# Patient Record
Sex: Female | Born: 1950 | Race: Black or African American | Hispanic: No | Marital: Married | Smoking: Never smoker
Health system: Southern US, Community
[De-identification: ages and names within clinical notes are randomized; demographics above are authoritative.]

## PROBLEM LIST (undated history)

## (undated) DIAGNOSIS — T7840XA Allergy, unspecified, initial encounter: Secondary | ICD-10-CM

## (undated) DIAGNOSIS — I10 Essential (primary) hypertension: Secondary | ICD-10-CM

## (undated) HISTORY — PX: ABDOMINAL HYSTERECTOMY: SHX81

## (undated) HISTORY — DX: Essential (primary) hypertension: I10

## (undated) HISTORY — DX: Allergy, unspecified, initial encounter: T78.40XA

---

## 2002-02-13 HISTORY — PX: TONSILLECTOMY: SUR1361

## 2007-02-14 HISTORY — PX: PARTIAL HYSTERECTOMY: SHX80

## 2015-05-26 DIAGNOSIS — Z1329 Encounter for screening for other suspected endocrine disorder: Secondary | ICD-10-CM | POA: Diagnosis not present

## 2015-05-26 DIAGNOSIS — Z7189 Other specified counseling: Secondary | ICD-10-CM | POA: Diagnosis not present

## 2015-05-26 DIAGNOSIS — Z13220 Encounter for screening for lipoid disorders: Secondary | ICD-10-CM | POA: Diagnosis not present

## 2015-05-26 DIAGNOSIS — Z1159 Encounter for screening for other viral diseases: Secondary | ICD-10-CM | POA: Diagnosis not present

## 2015-05-26 DIAGNOSIS — Z131 Encounter for screening for diabetes mellitus: Secondary | ICD-10-CM | POA: Diagnosis not present

## 2015-05-26 DIAGNOSIS — R7303 Prediabetes: Secondary | ICD-10-CM | POA: Diagnosis not present

## 2015-05-26 DIAGNOSIS — I10 Essential (primary) hypertension: Secondary | ICD-10-CM | POA: Diagnosis not present

## 2015-05-26 DIAGNOSIS — Z6831 Body mass index (BMI) 31.0-31.9, adult: Secondary | ICD-10-CM | POA: Diagnosis not present

## 2015-06-23 DIAGNOSIS — D72819 Decreased white blood cell count, unspecified: Secondary | ICD-10-CM | POA: Diagnosis not present

## 2015-06-23 DIAGNOSIS — R7303 Prediabetes: Secondary | ICD-10-CM | POA: Diagnosis not present

## 2015-06-23 DIAGNOSIS — Z1211 Encounter for screening for malignant neoplasm of colon: Secondary | ICD-10-CM | POA: Diagnosis not present

## 2015-06-23 DIAGNOSIS — Z23 Encounter for immunization: Secondary | ICD-10-CM | POA: Diagnosis not present

## 2015-06-23 DIAGNOSIS — Z6832 Body mass index (BMI) 32.0-32.9, adult: Secondary | ICD-10-CM | POA: Diagnosis not present

## 2015-06-23 DIAGNOSIS — Z1239 Encounter for other screening for malignant neoplasm of breast: Secondary | ICD-10-CM | POA: Diagnosis not present

## 2015-06-23 DIAGNOSIS — I10 Essential (primary) hypertension: Secondary | ICD-10-CM | POA: Diagnosis not present

## 2015-06-23 DIAGNOSIS — Z0000 Encounter for general adult medical examination without abnormal findings: Secondary | ICD-10-CM | POA: Diagnosis not present

## 2016-06-12 DIAGNOSIS — R7309 Other abnormal glucose: Secondary | ICD-10-CM | POA: Diagnosis not present

## 2016-06-12 DIAGNOSIS — Z79899 Other long term (current) drug therapy: Secondary | ICD-10-CM | POA: Diagnosis not present

## 2016-06-12 DIAGNOSIS — I10 Essential (primary) hypertension: Secondary | ICD-10-CM | POA: Diagnosis not present

## 2016-06-12 DIAGNOSIS — E559 Vitamin D deficiency, unspecified: Secondary | ICD-10-CM | POA: Diagnosis not present

## 2016-06-12 DIAGNOSIS — Z6831 Body mass index (BMI) 31.0-31.9, adult: Secondary | ICD-10-CM | POA: Diagnosis not present

## 2016-10-20 DIAGNOSIS — E2839 Other primary ovarian failure: Secondary | ICD-10-CM | POA: Diagnosis not present

## 2016-10-20 DIAGNOSIS — R7309 Other abnormal glucose: Secondary | ICD-10-CM | POA: Diagnosis not present

## 2016-10-20 DIAGNOSIS — Z23 Encounter for immunization: Secondary | ICD-10-CM | POA: Diagnosis not present

## 2016-10-20 DIAGNOSIS — I10 Essential (primary) hypertension: Secondary | ICD-10-CM | POA: Diagnosis not present

## 2016-10-20 DIAGNOSIS — Z0000 Encounter for general adult medical examination without abnormal findings: Secondary | ICD-10-CM | POA: Diagnosis not present

## 2016-10-20 DIAGNOSIS — M25511 Pain in right shoulder: Secondary | ICD-10-CM | POA: Diagnosis not present

## 2016-10-26 ENCOUNTER — Other Ambulatory Visit: Payer: Self-pay | Admitting: Internal Medicine

## 2016-10-26 DIAGNOSIS — E2839 Other primary ovarian failure: Secondary | ICD-10-CM

## 2016-10-26 DIAGNOSIS — Z1231 Encounter for screening mammogram for malignant neoplasm of breast: Secondary | ICD-10-CM

## 2017-01-11 DIAGNOSIS — L309 Dermatitis, unspecified: Secondary | ICD-10-CM | POA: Diagnosis not present

## 2017-01-11 DIAGNOSIS — L609 Nail disorder, unspecified: Secondary | ICD-10-CM | POA: Diagnosis not present

## 2017-02-01 DIAGNOSIS — Z1212 Encounter for screening for malignant neoplasm of rectum: Secondary | ICD-10-CM | POA: Diagnosis not present

## 2017-02-01 DIAGNOSIS — Z1211 Encounter for screening for malignant neoplasm of colon: Secondary | ICD-10-CM | POA: Diagnosis not present

## 2017-02-20 DIAGNOSIS — J309 Allergic rhinitis, unspecified: Secondary | ICD-10-CM | POA: Diagnosis not present

## 2017-02-20 DIAGNOSIS — R0689 Other abnormalities of breathing: Secondary | ICD-10-CM | POA: Diagnosis not present

## 2017-02-20 DIAGNOSIS — J209 Acute bronchitis, unspecified: Secondary | ICD-10-CM | POA: Diagnosis not present

## 2017-03-12 DIAGNOSIS — J0120 Acute ethmoidal sinusitis, unspecified: Secondary | ICD-10-CM | POA: Diagnosis not present

## 2017-03-12 DIAGNOSIS — I10 Essential (primary) hypertension: Secondary | ICD-10-CM | POA: Diagnosis not present

## 2017-03-12 DIAGNOSIS — R195 Other fecal abnormalities: Secondary | ICD-10-CM | POA: Diagnosis not present

## 2017-04-20 DIAGNOSIS — E559 Vitamin D deficiency, unspecified: Secondary | ICD-10-CM | POA: Diagnosis not present

## 2017-04-20 DIAGNOSIS — R7309 Other abnormal glucose: Secondary | ICD-10-CM | POA: Diagnosis not present

## 2017-04-20 DIAGNOSIS — I10 Essential (primary) hypertension: Secondary | ICD-10-CM | POA: Diagnosis not present

## 2017-04-20 DIAGNOSIS — Z79899 Other long term (current) drug therapy: Secondary | ICD-10-CM | POA: Diagnosis not present

## 2017-04-20 LAB — LIPID PANEL
Cholesterol: 185 (ref 0–200)
HDL: 56 (ref 35–70)
LDL Cholesterol: 117
LDl/HDL Ratio: 2.1
Triglycerides: 58 (ref 40–160)

## 2017-04-20 LAB — HEPATIC FUNCTION PANEL
ALT: 13 (ref 7–35)
AST: 12 — AB (ref 13–35)
Alkaline Phosphatase: 43 (ref 25–125)

## 2017-04-20 LAB — BASIC METABOLIC PANEL
BUN: 10 (ref 4–21)
Creatinine: 1 (ref 0.5–1.1)
Glucose: 85
Potassium: 4.3 (ref 3.4–5.3)
Sodium: 142 (ref 137–147)

## 2017-04-20 LAB — HEMOGLOBIN A1C: Hemoglobin A1C: 6

## 2017-04-20 LAB — VITAMIN D 25 HYDROXY (VIT D DEFICIENCY, FRACTURES): Vit D, 25-Hydroxy: 130

## 2017-04-26 DIAGNOSIS — D123 Benign neoplasm of transverse colon: Secondary | ICD-10-CM | POA: Diagnosis not present

## 2017-04-26 DIAGNOSIS — R195 Other fecal abnormalities: Secondary | ICD-10-CM | POA: Diagnosis not present

## 2017-04-26 DIAGNOSIS — D122 Benign neoplasm of ascending colon: Secondary | ICD-10-CM | POA: Diagnosis not present

## 2017-04-26 DIAGNOSIS — Z1211 Encounter for screening for malignant neoplasm of colon: Secondary | ICD-10-CM | POA: Diagnosis not present

## 2017-04-26 DIAGNOSIS — K635 Polyp of colon: Secondary | ICD-10-CM | POA: Diagnosis not present

## 2017-06-14 DIAGNOSIS — J309 Allergic rhinitis, unspecified: Secondary | ICD-10-CM | POA: Diagnosis not present

## 2017-06-14 DIAGNOSIS — J320 Chronic maxillary sinusitis: Secondary | ICD-10-CM | POA: Diagnosis not present

## 2017-06-14 DIAGNOSIS — H109 Unspecified conjunctivitis: Secondary | ICD-10-CM | POA: Diagnosis not present

## 2017-12-01 ENCOUNTER — Encounter: Payer: Self-pay | Admitting: Internal Medicine

## 2017-12-01 DIAGNOSIS — J309 Allergic rhinitis, unspecified: Secondary | ICD-10-CM

## 2017-12-05 ENCOUNTER — Ambulatory Visit: Payer: Self-pay | Admitting: Internal Medicine

## 2017-12-05 ENCOUNTER — Ambulatory Visit: Payer: Self-pay

## 2017-12-12 ENCOUNTER — Ambulatory Visit (INDEPENDENT_AMBULATORY_CARE_PROVIDER_SITE_OTHER): Payer: Medicare Other

## 2017-12-12 ENCOUNTER — Encounter: Payer: Self-pay | Admitting: Internal Medicine

## 2017-12-12 ENCOUNTER — Ambulatory Visit (INDEPENDENT_AMBULATORY_CARE_PROVIDER_SITE_OTHER): Payer: Medicare Other | Admitting: Internal Medicine

## 2017-12-12 VITALS — BP 122/72 | HR 60 | Temp 97.9°F | Ht 58.5 in | Wt 163.4 lb

## 2017-12-12 VITALS — BP 122/72 | HR 98 | Temp 97.9°F | Resp 98 | Ht 58.5 in | Wt 163.0 lb

## 2017-12-12 DIAGNOSIS — M67431 Ganglion, right wrist: Secondary | ICD-10-CM | POA: Diagnosis not present

## 2017-12-12 DIAGNOSIS — Z23 Encounter for immunization: Secondary | ICD-10-CM

## 2017-12-12 DIAGNOSIS — R7303 Prediabetes: Secondary | ICD-10-CM

## 2017-12-12 DIAGNOSIS — Z0000 Encounter for general adult medical examination without abnormal findings: Secondary | ICD-10-CM | POA: Diagnosis not present

## 2017-12-12 DIAGNOSIS — E7800 Pure hypercholesterolemia, unspecified: Secondary | ICD-10-CM

## 2017-12-12 DIAGNOSIS — I10 Essential (primary) hypertension: Secondary | ICD-10-CM | POA: Diagnosis not present

## 2017-12-12 DIAGNOSIS — R7309 Other abnormal glucose: Secondary | ICD-10-CM | POA: Diagnosis not present

## 2017-12-12 NOTE — Patient Instructions (Signed)
You may wear a wrist for 3-7 days when you have increased flairs of pain of your R wrist.    Ganglion Cyst A ganglion cyst is a noncancerous, fluid-filled lump that occurs near joints or tendons. The ganglion cyst grows out of a joint or the lining of a tendon. It most often develops in the hand or wrist, but it can also develop in the shoulder, elbow, hip, knee, ankle, or foot. The round or oval ganglion cyst can be the size of a pea or larger than a grape. Increased activity may enlarge the size of the cyst because more fluid starts to build up. What are the causes? It is not known what causes a ganglion cyst to grow. However, it may be related to:  Inflammation or irritation around the joint.  An injury.  Repetitive movements or overuse.  Arthritis.  What increases the risk? Risk factors include:  Being a woman.  Being age 75-50.  What are the signs or symptoms? Symptoms may include:  A lump. This most often appears on the hand or wrist, but it can occur in other areas of the body.  Tingling.  Pain.  Numbness.  Muscle weakness.  Weak grip.  Less movement in a joint.  How is this diagnosed? Ganglion cysts are most often diagnosed based on a physical exam. Your health care provider will feel the lump and may shine a light alongside it. If it is a ganglion cyst, a light often shines through it. Your health care provider may order an X-ray, ultrasound, or MRI to rule out other conditions. How is this treated? Ganglion cysts usually go away on their own without treatment. If pain or other symptoms are involved, treatment may be needed. Treatment is also needed if the ganglion cyst limits your movement or if it gets infected. Treatment may include:  Wearing a brace or splint on your wrist or finger.  Taking anti-inflammatory medicine.  Draining fluid from the lump with a needle (aspiration).  Injecting a steroid into the joint.  Surgery to remove the ganglion  cyst.  Follow these instructions at home:  Do not press on the ganglion cyst, poke it with a needle, or hit it.  Take medicines only as directed by your health care provider.  Wear your brace or splint as directed by your health care provider.  Watch your ganglion cyst for any changes.  Keep all follow-up visits as directed by your health care provider. This is important. Contact a health care provider if:  Your ganglion cyst becomes larger or more painful.  You have increased redness, red streaks, or swelling.  You have pus coming from the lump.  You have weakness or numbness in the affected area.  You have a fever or chills. This information is not intended to replace advice given to you by your health care provider. Make sure you discuss any questions you have with your health care provider. Document Released: 01/28/2000 Document Revised: 07/08/2015 Document Reviewed: 07/15/2013 Elsevier Interactive Patient Education  2018 [employer].

## 2017-12-12 NOTE — Patient Instructions (Signed)
Alyssa Lee , Thank you for taking time to come for your Medicare Wellness Visit. I appreciate your ongoing commitment to your health goals. Please review the following plan we discussed and let me know if I can assist you in the future.   Screening recommendations/referrals: Colonoscopy: fobt due 06/2018 Mammogram: due Bone Density: due Recommended yearly ophthalmology/optometry visit for glaucoma screening and checkup Recommended yearly dental visit for hygiene and checkup  Vaccinations: Influenza vaccine: decline Pneumococcal vaccine: none Tdap vaccine: 06/24/2017 Shingles vaccine: decline    Advanced directives: Advance directive discussed with you today. I have provided a copy for you to complete at home and have notarized. Once this is complete please bring a copy in to our office so we can scan it into your chart.   Conditions/risks identified: Obesity: patient wants to start exercising regularly. Tends to 43 month old at this time.  Requires a lot of energy.  Next appointment: 03/14/2018 at 11:15a   Preventive Care 65 Years and Older, Female Preventive care refers to lifestyle choices and visits with your health care provider that can promote health and wellness. What does preventive care include?  A yearly physical exam. This is also called an annual well check.  Dental exams once or twice a year.  Routine eye exams. Ask your health care provider how often you should have your eyes checked.  Personal lifestyle choices, including:  Daily care of your teeth and gums.  Regular physical activity.  Eating a healthy diet.  Avoiding tobacco and drug use.  Limiting alcohol use.  Practicing safe sex.  Taking low-dose aspirin every day.  Taking vitamin and mineral supplements as recommended by your health care provider. What happens during an annual well check? The services and screenings done by your health care provider during your annual well check will depend on  your age, overall health, lifestyle risk factors, and family history of disease. Counseling  Your health care provider may ask you questions about your:  Alcohol use.  Tobacco use.  Drug use.  Emotional well-being.  Home and relationship well-being.  Sexual activity.  Eating habits.  History of falls.  Memory and ability to understand (cognition).  Work and work Statistician.  Reproductive health. Screening  You may have the following tests or measurements:  Height, weight, and BMI.  Blood pressure.  Lipid and cholesterol levels. These may be checked every 5 years, or more frequently if you are over 3 years old.  Skin check.  Lung cancer screening. You may have this screening every year starting at age 67 if you have a 30-pack-year history of smoking and currently smoke or have quit within the past 15 years.  Fecal occult blood test (FOBT) of the stool. You may have this test every year starting at age 74.  Flexible sigmoidoscopy or colonoscopy. You may have a sigmoidoscopy every 5 years or a colonoscopy every 10 years starting at age 86.  Hepatitis C blood test.  Hepatitis B blood test.  Sexually transmitted disease (STD) testing.  Diabetes screening. This is done by checking your blood sugar (glucose) after you have not eaten for a while (fasting). You may have this done every 1-3 years.  Bone density scan. This is done to screen for osteoporosis. You may have this done starting at age 104.  Mammogram. This may be done every 1-2 years. Talk to your health care provider about how often you should have regular mammograms. Talk with your health care provider about your test results, treatment  options, and if necessary, the need for more tests. Vaccines  Your health care provider may recommend certain vaccines, such as:  Influenza vaccine. This is recommended every year.  Tetanus, diphtheria, and acellular pertussis (Tdap, Td) vaccine. You may need a Td booster  every 10 years.  Zoster vaccine. You may need this after age 90.  Pneumococcal 13-valent conjugate (PCV13) vaccine. One dose is recommended after age 12.  Pneumococcal polysaccharide (PPSV23) vaccine. One dose is recommended after age 50. Talk to your health care provider about which screenings and vaccines you need and how often you need them. This information is not intended to replace advice given to you by your health care provider. Make sure you discuss any questions you have with your health care provider. Document Released: 02/26/2015 Document Revised: 10/20/2015 Document Reviewed: 12/01/2014 Elsevier Interactive Patient Education  2017 [place] Prevention in the Home Falls can cause injuries. They can happen to people of all ages. There are many things you can do to make your home safe and to help prevent falls. What can I do on the outside of my home?  Regularly fix the edges of walkways and driveways and fix any cracks.  Remove anything that might make you trip as you walk through a door, such as a raised step or threshold.  Trim any bushes or trees on the path to your home.  Use bright outdoor lighting.  Clear any walking paths of anything that might make someone trip, such as rocks or tools.  Regularly check to see if handrails are loose or broken. Make sure that both sides of any steps have handrails.  Any raised decks and porches should have guardrails on the edges.  Have any leaves, snow, or ice cleared regularly.  Use sand or salt on walking paths during winter.  Clean up any spills in your garage right away. This includes oil or grease spills. What can I do in the bathroom?  Use night lights.  Install grab bars by the toilet and in the tub and shower. Do not use towel bars as grab bars.  Use non-skid mats or decals in the tub or shower.  If you need to sit down in the shower, use a plastic, non-slip stool.  Keep the floor dry. Clean up any  water that spills on the floor as soon as it happens.  Remove soap buildup in the tub or shower regularly.  Attach bath mats securely with double-sided non-slip rug tape.  Do not have throw rugs and other things on the floor that can make you trip. What can I do in the bedroom?  Use night lights.  Make sure that you have a light by your bed that is easy to reach.  Do not use any sheets or blankets that are too big for your bed. They should not hang down onto the floor.  Have a firm chair that has side arms. You can use this for support while you get dressed.  Do not have throw rugs and other things on the floor that can make you trip. What can I do in the kitchen?  Clean up any spills right away.  Avoid walking on wet floors.  Keep items that you use a lot in easy-to-reach places.  If you need to reach something above you, use a strong step stool that has a grab bar.  Keep electrical cords out of the way.  Do not use floor polish or wax that makes floors slippery.  If you must use wax, use non-skid floor wax.  Do not have throw rugs and other things on the floor that can make you trip. What can I do with my stairs?  Do not leave any items on the stairs.  Make sure that there are handrails on both sides of the stairs and use them. Fix handrails that are broken or loose. Make sure that handrails are as long as the stairways.  Check any carpeting to make sure that it is firmly attached to the stairs. Fix any carpet that is loose or worn.  Avoid having throw rugs at the top or bottom of the stairs. If you do have throw rugs, attach them to the floor with carpet tape.  Make sure that you have a light switch at the top of the stairs and the bottom of the stairs. If you do not have them, ask someone to add them for you. What else can I do to help prevent falls?  Wear shoes that:  Do not have high heels.  Have rubber bottoms.  Are comfortable and fit you well.  Are closed  at the toe. Do not wear sandals.  If you use a stepladder:  Make sure that it is fully opened. Do not climb a closed stepladder.  Make sure that both sides of the stepladder are locked into place.  Ask someone to hold it for you, if possible.  Clearly mark and make sure that you can see:  Any grab bars or handrails.  First and last steps.  Where the edge of each step is.  Use tools that help you move around (mobility aids) if they are needed. These include:  Canes.  Walkers.  Scooters.  Crutches.  Turn on the lights when you go into a dark area. Replace any light bulbs as soon as they burn out.  Set up your furniture so you have a clear path. Avoid moving your furniture around.  If any of your floors are uneven, fix them.  If there are any pets around you, be aware of where they are.  Review your medicines with your doctor. Some medicines can make you feel dizzy. This can increase your chance of falling. Ask your doctor what other things that you can do to help prevent falls. This information is not intended to replace advice given to you by your health care provider. Make sure you discuss any questions you have with your health care provider. Document Released: 11/26/2008 Document Revised: 07/08/2015 Document Reviewed: 03/06/2014 Elsevier Interactive Patient Education  2017 [employer].

## 2017-12-12 NOTE — Progress Notes (Addendum)
Subjective:     Patient ID: Alyssa Lee , female    DOB: September 10, 1950 , 67 y.o.   MRN: [id number]     HPI 1-Here for HTN FU, average is usually 120/ 70-80's. Has not been walking like she should since she is raising her 36 month old infant grandson. 2- R medial wrist lump x 2 months, is a little tender and admits of using her R hand more with cleaning, lifting her 30 lb infant, and walking her dog. She denies an acute injury.    Past Medical History:  Diagnosis Date  . Hypertension    Prediabetes  Family History  Problem Relation Age of Onset  . Hyperthyroidism Mother   . Hypertension Mother   . Diabetes Mother   . Hypertension Father   . COPD Father   . Kidney failure Father      Current Outpatient Medications:  .  amLODipine (NORVASC) 10 MG tablet, Take 10 mg by mouth daily., Disp: , Rfl:  .  Cholecalciferol (VITAMIN D3) 5000 units CAPS, Take by mouth., Disp: , Rfl:  .  lisinopril-hydrochlorothiazide (PRINZIDE,ZESTORETIC) 20-25 MG tablet, Take 1 tablet by mouth daily., Disp: , Rfl:    No Known Allergies   Review of Systems  Constitutional: Positive for diaphoresis.       Gets occasional sweating at night and taking her feet out of the covers helps her cool down  HENT: Negative for tinnitus.   Respiratory: Negative for chest tightness and shortness of breath.   Cardiovascular: Negative for chest pain, palpitations and leg swelling.  Gastrointestinal: Positive for nausea. Negative for abdominal pain, blood in stool and vomiting.       Gets L lower chest pain associated with food or if she ateas late. Eating helps.  Gets nausea sometimes in the am.   Musculoskeletal:       Has R medial wrist lump x 2 months and uses R wrist  Neurological: Positive for dizziness. Negative for facial asymmetry, speech difficulty, weakness and headaches.       Has had slight off balance issues associated with allergies.      Today's Vitals   12/12/17 1220  BP: 122/72  Pulse: 60   Temp: 97.9 F (36.6 C)  TempSrc: Oral  SpO2: 98%  Weight: 163 lb 6.4 oz (74.1 kg)  Height: 4' 10.5" (1.486 m)   Body mass index is 33.57 kg/m.   Objective:  Physical Exam   Constitutional: She is oriented to person, place, and time. She appears well-developed and well-nourished. No distress.  HENT:  Head: Normocephalic and atraumatic.  Right Ear: External ear normal.  Left Ear: External ear normal.  Nose: Nose normal.  Eyes: Conjunctivae are normal. Right eye exhibits no discharge. Left eye exhibits no discharge. No scleral icterus.  Neck: Neck supple. No thyromegaly present.  No carotid bruits bilaterally  Cardiovascular: Normal rate and regular rhythm.  No murmur heard. Pulmonary/Chest: Effort normal and breath sounds normal. No respiratory distress.  Musculoskeletal: Normal range of motion. She exhibits no edema.  R WRIST/HAND- has 1.5 x 1.5 cm rubbery mobile mass on distal radius which is a little tender. ROM of wrist is normal. No redness or swelling noted.  Lymphadenopathy:    She has no cervical adenopathy.  Neurological: She is alert and oriented to person, place, and time.  Skin: Skin is warm and dry. Capillary refill takes less than 2 seconds. No rash noted. She is not diaphoretic.  Psychiatric: She has a normal mood and  affect. Her behavior is normal. Judgment and thought content normal.  Nursing note reviewed.     Assessment And Plan:  1. Need for influenza vaccination- she was given  - Flu vaccine HIGH DOSE PF (Fluzone High dose)  2. Essential hypertension- controlled. I ordered the following:  - CMP14 + Anion Gap - CBC no Diff Medicare wellness visit was done by LPN today 3. Abnormal glucose- has been prediabetic in the past. I ordered the following - Hemoglobin A1c  4. Elevated LDL cholesterol level- chronic. The following was ordered  - Lipid Profile - TSH - T4, Free - T3, free  5. Ganglion cyst of wrist, right- acute. Advised to wear a wrist  splint. immobilizer for 3-7 days when she has increased pain. We can refer to ortho, if it continues bothering her.    May continue current medication, advised to take walks at least 30 minutes twice a week. She will try this.   Wilton Thrall RODRIGUEZ-SOUTHWORTH, PA-C

## 2017-12-12 NOTE — Progress Notes (Signed)
Subjective:   Alyssa Lee is a 67 y.o. female who presents for Medicare Annual (Subsequent) preventive examination.  Review of Systems:  n/a Cardiac Risk Factors include: advanced age (>75men, >92 women)     Objective:     Vitals: BP 122/72 (BP Location: Left Arm)   Pulse 98   Temp 97.9 F (36.6 C)   Resp (!) 98   Ht 4' 10.5" (1.486 m)   Wt 163 lb (73.9 kg)   BMI 33.49 kg/m   Body mass index is 33.49 kg/m.  Advanced Directives 12/12/2017  Does Patient Have a Medical Advance Directive? No  Would patient like information on creating a medical advance directive? Yes (MAU/Ambulatory/Procedural Areas - Information given)    Tobacco Social History   Tobacco Use  Smoking Status Never Smoker  Smokeless Tobacco Never Used     Counseling given: Not Answered   Clinical Intake:  Pre-visit preparation completed: Yes  Pain : No/denies pain Pain Score: 0-No pain     Nutritional Status: BMI > 30  Obese Nutritional Risks: None Diabetes: No  How often do you need to have someone help you when you read instructions, pamphlets, or other written materials from your doctor or pharmacy?: 1 - Never What is the last grade level you completed in school?: associate degree  Interpreter Needed?: No  Information entered by :: NAllen LPN  Past Medical History:  Diagnosis Date  . Hypertension    Past Surgical History:  Procedure Laterality Date  . CESAREAN SECTION    . PARTIAL HYSTERECTOMY  2009  . TONSILLECTOMY  2004   Family History  Problem Relation Age of Onset  . Hyperthyroidism Mother   . Hypertension Mother   . Diabetes Mother   . Hypertension Father   . COPD Father   . Kidney failure Father    Social History   Socioeconomic History  . Marital status: Married    Spouse name: Not on file  . Number of children: Not on file  . Years of education: Not on file  . Highest education level: Not on file  Occupational History  . Occupation: retired  Photographer  . Financial resource strain: Not hard at all  . Food insecurity:    Worry: Never true    Inability: Never true  . Transportation needs:    Medical: No    Non-medical: No  Tobacco Use  . Smoking status: Never Smoker  . Smokeless tobacco: Never Used  Substance and Sexual Activity  . Alcohol use: Never    Frequency: Never  . Drug use: Never  . Sexual activity: Not Currently  Lifestyle  . Physical activity:    Days per week: 0 days    Minutes per session: 0 min  . Stress: Not at all  Relationships  . Social connections:    Talks on phone: Not on file    Gets together: Not on file    Attends religious service: Not on file    Active member of club or organization: Not on file    Attends meetings of clubs or organizations: Not on file    Relationship status: Not on file  Other Topics Concern  . Not on file  Social History Narrative  . Not on file    Outpatient Encounter Medications as of 12/12/2017  Medication Sig  . amLODipine (NORVASC) 10 MG tablet Take 10 mg by mouth daily.  . Cholecalciferol (VITAMIN D3) 5000 units CAPS Take by mouth.  . fluticasone (FLONASE)  50 MCG/ACT nasal spray   . lisinopril-hydrochlorothiazide (PRINZIDE,ZESTORETIC) 20-25 MG tablet Take 1 tablet by mouth daily.   No facility-administered encounter medications on file as of 12/12/2017.     Activities of Daily Living In your present state of health, do you have any difficulty performing the following activities: 12/12/2017  Hearing? N  Vision? Y  Comment uses over the counter readers  Difficulty concentrating or making decisions? N  Walking or climbing stairs? N  Dressing or bathing? N  Doing errands, shopping? N  Preparing Food and eating ? N  Using the Toilet? N  In the past six months, have you accidently leaked urine? N  Do you have problems with loss of bowel control? N  Managing your Medications? N  Managing your Finances? N  Housekeeping or managing your Housekeeping? N  Some  recent data might be hidden    Patient Care Team: Glendale Chard, MD as PCP - General (Internal Medicine)    Assessment:   This is a routine wellness examination for Alyssa Lee.  Exercise Activities and Dietary recommendations Current Exercise Habits: The patient does not participate in regular exercise at present, Exercise limited by: None identified  Goals    . Exercise 150 min/wk Moderate Activity (pt-stated)     Would like to exercise regularly       Fall Risk Fall Risk  12/12/2017 12/12/2017  Falls in the past year? No No  Risk for fall due to : Medication side effect -   Is the patient's home free of loose throw rugs in walkways, pet beds, electrical cords, etc?   yes      Grab bars in the bathroom? no      Handrails on the stairs?   yes      Adequate lighting?   yes  Timed Get Up and Go performed: n/a  Depression Screen PHQ 2/9 Scores 12/12/2017 12/12/2017  PHQ - 2 Score 0 0  PHQ- 9 Score 3 -     Cognitive Function     6CIT Screen 12/12/2017  What Year? 0 points  What month? 0 points  What time? 0 points  Months in reverse 0 points  Repeat phrase 0 points    Immunization History  Administered Date(s) Administered  . Influenza, High Dose Seasonal PF 12/12/2017    Qualifies for Shingles Vaccine?yes  Screening Tests Health Maintenance  Topic Date Due  . Hepatitis C Screening  June 03, 1950  . TETANUS/TDAP  04/01/1969  . MAMMOGRAM  04/01/2000  . COLONOSCOPY  04/01/2000  . DEXA SCAN  04/02/2015  . PNA vac Low Risk Adult (1 of 2 - PCV13) 04/02/2015  . INFLUENZA VACCINE  09/13/2017    Cancer Screenings: Lung: Low Dose CT Chest recommended if Age 67-80 years, 30 pack-year currently smoking OR have quit w/in 15years. Patient does not qualify. Breast:  Up to date on Mammogram? no   Up to date of Bone Density/Dexa? No Colorectal: fobt due 06/2018  Additional Screenings: : Hepatitis C Screening: due     Plan:    Patient would like to start exercising  regularly   I have personally reviewed and noted the following in the patient's chart:   . Medical and social history . Use of alcohol, tobacco or illicit drugs  . Current medications and supplements . Functional ability and status . Nutritional status . Physical activity . Advanced directives . List of other physicians . Hospitalizations, surgeries, and ER visits in previous 12 months . Vitals . Screenings to include  cognitive, depression, and falls . Referrals and appointments  In addition, I have reviewed and discussed with patient certain preventive protocols, quality metrics, and best practice recommendations. A written personalized care plan for preventive services as well as general preventive health recommendations were provided to patient.     Kellie Simmering, LPN  14/48/1856

## 2017-12-13 LAB — CMP14 + ANION GAP
ALT: 14 IU/L (ref 0–32)
AST: 16 IU/L (ref 0–40)
Albumin/Globulin Ratio: 1.5 (ref 1.2–2.2)
Albumin: 4.5 g/dL (ref 3.6–4.8)
Alkaline Phosphatase: 49 IU/L (ref 39–117)
Anion Gap: 14 mmol/L (ref 10.0–18.0)
BUN/Creatinine Ratio: 18 (ref 12–28)
BUN: 17 mg/dL (ref 8–27)
Bilirubin Total: 0.3 mg/dL (ref 0.0–1.2)
CO2: 27 mmol/L (ref 20–29)
Calcium: 10 mg/dL (ref 8.7–10.3)
Chloride: 98 mmol/L (ref 96–106)
Creatinine, Ser: 0.94 mg/dL (ref 0.57–1.00)
GFR calc Af Amer: 73 mL/min/{1.73_m2} (ref >59)
GFR calc non Af Amer: 63 mL/min/{1.73_m2} (ref >59)
Globulin, Total: 3.1 g/dL (ref 1.5–4.5)
Glucose: 93 mg/dL (ref 65–99)
Potassium: 4.3 mmol/L (ref 3.5–5.2)
Sodium: 139 mmol/L (ref 134–144)
Total Protein: 7.6 g/dL (ref 6.0–8.5)

## 2017-12-13 LAB — TSH: TSH: 1.29 u[IU]/mL (ref 0.450–4.500)

## 2017-12-13 LAB — CBC
Hematocrit: 40 % (ref 34.0–46.6)
Hemoglobin: 13.3 g/dL (ref 11.1–15.9)
MCH: 28.2 pg (ref 26.6–33.0)
MCHC: 33.3 g/dL (ref 31.5–35.7)
MCV: 85 fL (ref 79–97)
Platelets: 302 10*3/uL (ref 150–450)
RBC: 4.71 x10E6/uL (ref 3.77–5.28)
RDW: 13.1 % (ref 12.3–15.4)
WBC: 4 10*3/uL (ref 3.4–10.8)

## 2017-12-13 LAB — LIPID PANEL
Chol/HDL Ratio: 3.6 ratio (ref 0.0–4.4)
Cholesterol, Total: 210 mg/dL — ABNORMAL HIGH (ref 100–199)
HDL: 58 mg/dL (ref >39)
LDL Calculated: 128 mg/dL — ABNORMAL HIGH (ref 0–99)
Triglycerides: 119 mg/dL (ref 0–149)
VLDL Cholesterol Cal: 24 mg/dL (ref 5–40)

## 2017-12-13 LAB — HEMOGLOBIN A1C
Est. average glucose Bld gHb Est-mCnc: 128 mg/dL
Hgb A1c MFr Bld: 6.1 % — ABNORMAL HIGH (ref 4.8–5.6)

## 2017-12-13 LAB — T4, FREE: Free T4: 1.3 ng/dL (ref 0.82–1.77)

## 2017-12-13 LAB — T3, FREE: T3, Free: 2.9 pg/mL (ref 2.0–4.4)

## 2018-02-18 ENCOUNTER — Other Ambulatory Visit: Payer: Self-pay | Admitting: Internal Medicine

## 2018-03-14 ENCOUNTER — Ambulatory Visit (INDEPENDENT_AMBULATORY_CARE_PROVIDER_SITE_OTHER): Payer: Medicare Other | Admitting: Internal Medicine

## 2018-03-14 ENCOUNTER — Encounter: Payer: Self-pay | Admitting: Internal Medicine

## 2018-03-14 ENCOUNTER — Other Ambulatory Visit: Payer: Self-pay

## 2018-03-14 VITALS — BP 130/72 | HR 60 | Temp 98.7°F | Ht <= 58 in | Wt 159.6 lb

## 2018-03-14 DIAGNOSIS — I10 Essential (primary) hypertension: Secondary | ICD-10-CM | POA: Diagnosis not present

## 2018-03-14 DIAGNOSIS — M779 Enthesopathy, unspecified: Secondary | ICD-10-CM | POA: Diagnosis not present

## 2018-03-14 DIAGNOSIS — E7800 Pure hypercholesterolemia, unspecified: Secondary | ICD-10-CM | POA: Diagnosis not present

## 2018-03-14 DIAGNOSIS — M778 Other enthesopathies, not elsewhere classified: Secondary | ICD-10-CM

## 2018-03-14 DIAGNOSIS — M6740 Ganglion, unspecified site: Secondary | ICD-10-CM | POA: Insufficient documentation

## 2018-03-14 DIAGNOSIS — R7303 Prediabetes: Secondary | ICD-10-CM | POA: Diagnosis not present

## 2018-03-14 MED ORDER — THUMB SPLINT/RIGHT SMALL MISC: 1.0000 [IU] | Freq: Every day | 0 refills | Status: AC

## 2018-03-14 NOTE — Patient Instructions (Signed)
Take your blood pressure  Medication in the morning including the water pill Take the Vit D with a meal that has some fat.

## 2018-03-14 NOTE — Progress Notes (Signed)
Subjective:     Patient ID: Alyssa Lee , female    DOB: May 13, 1950 , 68 y.o.   MRN: [id number]   Chief Complaint  Patient presents with  . Hypertension  . Wrist Pain    HPI  1- Pt is here for HTN FU.      BP's at home normally runs around 117/70-75.     She continues staying busy taking care of her 74 months old great             grandson.  2- Her R wrist continues to bother her. Has been wearing a soft hand and wrist brace, but because she uses her hands a lot lifting her GGS, she continues having pain. The ganglion cyst area is still present.  Past Medical History:  Diagnosis Date  . Hypertension      Family History  Problem Relation Age of Onset  . Hyperthyroidism Mother   . Hypertension Mother   . Diabetes Mother   . Hypertension Father   . COPD Father   . Kidney failure Father      Current Outpatient Medications:  .  amLODipine (NORVASC) 10 MG tablet, TAKE 1 TABLET DAILY, Disp: 90 tablet, Rfl: 4 .  Cholecalciferol (VITAMIN D3) 5000 units CAPS, Take by mouth., Disp: , Rfl:  .  fluticasone (FLONASE) 50 MCG/ACT nasal spray, , Disp: , Rfl:  .  lisinopril-hydrochlorothiazide (PRINZIDE,ZESTORETIC) 20-25 MG tablet, TAKE 1 TABLET DAILY, Disp: 90 tablet, Rfl: 4   No Known Allergies   Review of Systems  Constitutional: Negative for appetite change, diaphoresis and unexpected weight change.       Has been eating less meat and more vegetables and smaller portions.   HENT: Negative for congestion, postnasal drip and rhinorrhea.        Using Flonase most of the time has helped with post nasal drainage she used to have.  Denies epistaxis.  Eyes: Negative for visual disturbance.  Respiratory: Negative for shortness of breath.   Cardiovascular: Negative for chest pain, palpitations and leg swelling.  Gastrointestinal: Negative for constipation, diarrhea, nausea and vomiting.  Endocrine: Negative for polydipsia and polyphagia.  Genitourinary: Negative for difficulty  urinating, dysuria and frequency.       Nocturia from taking the diuretic at night time.   Musculoskeletal: Positive for arthralgias.       R wrist pain, see HPI  Skin: Negative for rash.  Neurological: Negative for dizziness, speech difficulty, weakness, light-headedness and headaches.     Today's Vitals   03/14/18 1104  BP: 130/72  Pulse: 60  Temp: 98.7 F (37.1 C)  TempSrc: Oral  SpO2: 98%  Weight: 159 lb 9.6 oz (72.4 kg)  Height: 4\' 10"  (1.473 m)   Body mass index is 33.36 kg/m.   Objective:  Physical Exam  Has lost 4 lb since October Constitutional: She is oriented to person, place, and time. She appears well-developed and well-nourished. No distress.  HENT:  Head: Normocephalic and atraumatic.  Right Ear: External ear normal.  Left Ear: External ear normal.  Nose: Nose normal.  Eyes: Conjunctivae are normal. Right eye exhibits no discharge. Left eye exhibits no discharge. No scleral icterus.  Neck: Neck supple. No thyromegaly present.  No carotid bruits bilaterally  Cardiovascular: Normal rate and regular rhythm.  No murmur heard. Pulmonary/Chest: Effort normal and breath sounds normal. No respiratory distress.  Musculoskeletal: Normal range of motion. She exhibits no edema. Has small ganglion cyst on radial aspect of R wrist and thumb tendon  is also tender with palpation and movement of her R thumb  Lymphadenopathy:    She has no cervical adenopathy.  Neurological: She is alert and oriented to person, place, and time.  Skin: Skin is warm and dry. Capillary refill takes less than 2 seconds. No rash noted. She is not diaphoretic.  Psychiatric: She has a normal mood and affect. Her behavior is normal. Judgment and thought content normal.  Nursing note reviewed.     Assessment And Plan:    1. Ganglion cyst- R wrist, unchnaged - Ambulatory referral to Orthopedic Surgery  2. Thumb tendonitis- R thumb, acute.      She was give Rx for thumb spica splint to wear qd  until she sees ortho.  - Ambulatory referral to Orthopedic Surgery  3. Essential hypertension- stable. May continue same medications. As she is loosing wt was told to watch out for orthostatic hypotension.  - CMP14 + Anion Gap  4. Elevated LDL cholesterol level- unknown status - Lipid Profile - CBC no Diff  5. Prediabetes- unknown status. - Hemoglobin A1c  Fu in 3 months for HTN  FU.   Devan Danzer RODRIGUEZ-SOUTHWORTH, PA-C

## 2018-03-15 ENCOUNTER — Ambulatory Visit (INDEPENDENT_AMBULATORY_CARE_PROVIDER_SITE_OTHER): Payer: Medicare Other | Admitting: Orthopaedic Surgery

## 2018-03-15 ENCOUNTER — Encounter (INDEPENDENT_AMBULATORY_CARE_PROVIDER_SITE_OTHER): Payer: Self-pay | Admitting: Orthopaedic Surgery

## 2018-03-15 ENCOUNTER — Ambulatory Visit (INDEPENDENT_AMBULATORY_CARE_PROVIDER_SITE_OTHER): Payer: Medicare Other

## 2018-03-15 VITALS — Ht <= 58 in | Wt 159.0 lb

## 2018-03-15 DIAGNOSIS — M654 Radial styloid tenosynovitis [de Quervain]: Secondary | ICD-10-CM

## 2018-03-15 LAB — CBC
Hematocrit: 38.5 % (ref 34.0–46.6)
Hemoglobin: 12.8 g/dL (ref 11.1–15.9)
MCH: 28.8 pg (ref 26.6–33.0)
MCHC: 33.2 g/dL (ref 31.5–35.7)
MCV: 87 fL (ref 79–97)
Platelets: 293 10*3/uL (ref 150–450)
RBC: 4.45 x10E6/uL (ref 3.77–5.28)
RDW: 13.6 % (ref 11.7–15.4)
WBC: 3.7 10*3/uL (ref 3.4–10.8)

## 2018-03-15 LAB — LIPID PANEL
Chol/HDL Ratio: 3.2 ratio (ref 0.0–4.4)
Cholesterol, Total: 203 mg/dL — ABNORMAL HIGH (ref 100–199)
HDL: 63 mg/dL (ref >39)
LDL Calculated: 125 mg/dL — ABNORMAL HIGH (ref 0–99)
Triglycerides: 76 mg/dL (ref 0–149)
VLDL Cholesterol Cal: 15 mg/dL (ref 5–40)

## 2018-03-15 LAB — CMP14 + ANION GAP
ALT: 12 IU/L (ref 0–32)
AST: 12 IU/L (ref 0–40)
Albumin/Globulin Ratio: 1.3 (ref 1.2–2.2)
Albumin: 4.4 g/dL (ref 3.8–4.8)
Alkaline Phosphatase: 49 IU/L (ref 39–117)
Anion Gap: 17 mmol/L (ref 10.0–18.0)
BUN/Creatinine Ratio: 15 (ref 12–28)
BUN: 17 mg/dL (ref 8–27)
Bilirubin Total: 0.4 mg/dL (ref 0.0–1.2)
CO2: 24 mmol/L (ref 20–29)
Calcium: 10.6 mg/dL — ABNORMAL HIGH (ref 8.7–10.3)
Chloride: 101 mmol/L (ref 96–106)
Creatinine, Ser: 1.15 mg/dL — ABNORMAL HIGH (ref 0.57–1.00)
GFR calc Af Amer: 57 mL/min/{1.73_m2} — ABNORMAL LOW (ref >59)
GFR calc non Af Amer: 49 mL/min/{1.73_m2} — ABNORMAL LOW (ref >59)
Globulin, Total: 3.4 g/dL (ref 1.5–4.5)
Glucose: 75 mg/dL (ref 65–99)
Potassium: 4.4 mmol/L (ref 3.5–5.2)
Sodium: 142 mmol/L (ref 134–144)
Total Protein: 7.8 g/dL (ref 6.0–8.5)

## 2018-03-15 LAB — HEMOGLOBIN A1C
Est. average glucose Bld gHb Est-mCnc: 128 mg/dL
Hgb A1c MFr Bld: 6.1 % — ABNORMAL HIGH (ref 4.8–5.6)

## 2018-03-15 MED ORDER — BUPIVACAINE HCL 0.5 % IJ SOLN
0.3300 mL | INTRAMUSCULAR | Status: AC | PRN
  Administered 2018-03-15: .33 mL

## 2018-03-15 MED ORDER — METHYLPREDNISOLONE ACETATE 40 MG/ML IJ SUSP
13.3300 mg | INTRAMUSCULAR | Status: AC | PRN
  Administered 2018-03-15: 13.33 mg

## 2018-03-15 MED ORDER — LIDOCAINE HCL 1 % IJ SOLN
0.3000 mL | INTRAMUSCULAR | Status: AC | PRN
  Administered 2018-03-15: .3 mL

## 2018-03-15 NOTE — Progress Notes (Signed)
Office Visit Note   Patient: Alyssa Lee           Date of Birth: [birth year]           MRN: [id number] Visit Date: 03/15/2018              Requested by: Shelby Mattocks, PA-C [address] [place], [place] [address] PCP: Glendale Chard, MD   Assessment & Plan: Visit Diagnoses:  1. De Quervain's tenosynovitis, right     Plan: Impression is right de Quervain's Tenosynovitis with cystic formation.  We will inject this with cortisone today.  Home also provide the patient with a removable thumb spica splint.  She will follow-up with [place] as needed.  Follow-Up Instructions: Return if symptoms worsen or fail to improve.   Orders:  Orders Placed This Encounter  Procedures  . XR Wrist 2 Views Right   No orders of the defined types were placed in this encounter.     Procedures: Hand/UE Inj: R extensor compartment 1 for de Quervain's tenosynovitis on 03/15/2018 11:41 AM Indications: pain Details: 25 G needle Medications: 0.3 mL lidocaine 1 %; 0.33 mL bupivacaine 0.5 %; 13.33 mg methylPREDNISolone acetate 40 MG/ML Outcome: tolerated well, no immediate complications Patient was prepped and draped in the usual sterile fashion.       Clinical Data: No additional findings.   Subjective: Chief Complaint  Patient presents with  . Right Wrist - Pain    HPI patient is a pleasant 68 year old female who presents our clinic today with right wrist pain.  This is been ongoing for the past month.  No known injury or change in activity.  The pain she has is to the first dorsal compartment.  Pain appears to be worse when using her thumb and occasional wrist movements.  She also notes that she is taking care of a 61-month-old baby boy which appears to aggravate her hand when picking up.  She denies any numbness, tingling or burning.  No fevers or chills.  She has not been wearing a brace.  Over-the-counter medications do not seem to help.  Review of Systems was  detailed in HPI.  All others reviewed and are negative.   Objective: Vital Signs: Ht 4\' 10"  (1.473 m)   Wt 159 lb (72.1 kg)   BMI 33.23 kg/m   Physical Exam well-developed well-nourished female no acute distress.  Alert and oriented x3.  Ortho Exam examination of her right wrist reveals moderate tenderness along the first dorsal compartment with a pea-sized palpable nodule that is also markedly tender.  Full range of motion of the wrist and thumb although she does have pain with ulnar deviation and Finkelstein.  She is neurovascularly intact distally.  Specialty Comments:  No specialty comments available.  Imaging: Xr Wrist 2 Views Right  Result Date: 03/15/2018 X-rays show mild spurring off the trapezium    PMFS History: Patient Active Problem List   Diagnosis Date Noted  . De Quervain's tenosynovitis, right 03/15/2018  . Ganglion cyst 03/14/2018  . Essential hypertension   . Prediabetes   . Rhinitis, allergic 06/14/2017   Past Medical History:  Diagnosis Date  . Hypertension     Family History  Problem Relation Age of Onset  . Hyperthyroidism Mother   . Hypertension Mother   . Diabetes Mother   . Hypertension Father   . COPD Father   . Kidney failure Father     Past Surgical History:  Procedure Laterality Date  . CESAREAN SECTION    .  PARTIAL HYSTERECTOMY  2009  . TONSILLECTOMY  2004   Social History   Occupational History  . Occupation: retired  Tobacco Use  . Smoking status: Never Smoker  . Smokeless tobacco: Never Used  Substance and Sexual Activity  . Alcohol use: Never    Frequency: Never  . Drug use: Never  . Sexual activity: Not Currently

## 2018-04-13 ENCOUNTER — Other Ambulatory Visit: Payer: Self-pay | Admitting: Internal Medicine

## 2018-06-13 ENCOUNTER — Ambulatory Visit: Payer: Medicare Other | Admitting: Internal Medicine

## 2018-06-20 ENCOUNTER — Ambulatory Visit (INDEPENDENT_AMBULATORY_CARE_PROVIDER_SITE_OTHER): Payer: Medicare Other | Admitting: Internal Medicine

## 2018-06-20 ENCOUNTER — Other Ambulatory Visit: Payer: Self-pay

## 2018-06-20 ENCOUNTER — Encounter: Payer: Self-pay | Admitting: Internal Medicine

## 2018-06-20 VITALS — BP 123/70 | HR 70 | Ht <= 58 in | Wt 157.5 lb

## 2018-06-20 DIAGNOSIS — N289 Disorder of kidney and ureter, unspecified: Secondary | ICD-10-CM | POA: Diagnosis not present

## 2018-06-20 DIAGNOSIS — J301 Allergic rhinitis due to pollen: Secondary | ICD-10-CM

## 2018-06-20 DIAGNOSIS — R7303 Prediabetes: Secondary | ICD-10-CM

## 2018-06-20 DIAGNOSIS — E6609 Other obesity due to excess calories: Secondary | ICD-10-CM | POA: Diagnosis not present

## 2018-06-20 DIAGNOSIS — Z6832 Body mass index (BMI) 32.0-32.9, adult: Secondary | ICD-10-CM

## 2018-06-20 DIAGNOSIS — I10 Essential (primary) hypertension: Secondary | ICD-10-CM

## 2018-06-20 MED ORDER — FLUTICASONE PROPIONATE 50 MCG/ACT NA SUSP: 2.0000 | Freq: Every day | NASAL | 1 refills | Status: DC

## 2018-06-20 MED ORDER — LISINOPRIL-HYDROCHLOROTHIAZIDE 20-25 MG PO TABS: 1.0000 | ORAL_TABLET | Freq: Every day | ORAL | 1 refills | Status: DC

## 2018-06-20 MED ORDER — AMLODIPINE BESYLATE 10 MG PO TABS: 10.0000 mg | ORAL_TABLET | Freq: Every day | ORAL | 1 refills | Status: DC

## 2018-06-20 NOTE — Patient Instructions (Signed)
Wrist Pain, Adult  There are many things that can cause wrist pain. Some common causes include:   An injury to the wrist area.   Overuse of the joint.   A condition that causes too much pressure to be put on a nerve in the wrist (carpal tunnel syndrome).   Wear and tear of the joints that happens as a person gets older (osteoarthritis).   Other types of arthritis.  Sometimes, the cause of wrist pain is not known. Often, the pain goes away when you follow your doctor's instructions for helping pain at home, such as resting or icing your wrist. If your wrist pain does not go away, it is important to tell your doctor.  Follow these instructions at home:   Rest the wrist area for 48 hours or more, or as long as told by your doctor.   If a splint or elastic bandage has been put on your wrist, use it as told by your doctor.  ? Take off the splint or bandage only as told by your doctor.  ? Loosen the splint or bandage if your fingers tingle, lose feeling (get numb), or turn cold or blue.   If directed, apply ice to the injured area:  ? If you have a removable splint or elastic bandage, remove it as told by your doctor.  ? Put ice in a plastic bag.  ? Place a towel between your skin and the bag or between your splint or bandage and the bag.  ? Leave the ice on for 20 minutes, 2-3 times a day.     Keep your arm raised (elevated) above the level of your heart while you are sitting or lying down.   Take over-the-counter and prescription medicines only as told by your doctor.   Keep all follow-up visits as told by your doctor. This is important.  Contact a doctor if:   You have a sudden sharp pain in the wrist, hand, or arm that is different or new.   The swelling or bruising on your wrist or hand gets worse.   Your skin becomes red, gets a rash, or has open sores.   Your pain does not get better or it gets worse.  Get help right away if:   You lose feeling in your fingers or hand.   Your fingers turn white,  very red, or cold and blue.   You cannot move your fingers.   You have a fever or chills.  This information is not intended to replace advice given to you by your health care provider. Make sure you discuss any questions you have with your health care provider.  Document Released: 07/19/2007 Document Revised: 08/26/2015 Document Reviewed: 08/19/2015  Elsevier Interactive Patient Education  2019 [employer]

## 2018-06-23 NOTE — Progress Notes (Signed)
Virtual Visit via Video   This visit type was conducted due to national recommendations for restrictions regarding the COVID-19 Pandemic (e.g. social distancing) in an effort to limit this patient's exposure and mitigate transmission in our community.  Due to her co-morbid illnesses, this patient is at least at moderate risk for complications without adequate follow up.  This format is felt to be most appropriate for this patient at this time.  All issues noted in this document were discussed and addressed.  A limited physical exam was performed with this format.    This visit type was conducted due to national recommendations for restrictions regarding the COVID-19 Pandemic (e.g. social distancing) in an effort to limit this patient's exposure and mitigate transmission in our community.  Patients identity confirmed using two different identifiers.  This format is felt to be most appropriate for this patient at this time.  All issues noted in this document were discussed and addressed.  No physical exam was performed (except for noted visual exam findings with Video Visits).    Date:  06/23/2018   ID:  Alyssa Lee, DOB 10/03/1950, MRN [id number]  Patient Location:  Home  Provider location:   Office    Chief Complaint:  HTN f/u  History of Present Illness:    Alyssa Lee is a 68 y.o. female who presents via video conferencing for a telehealth visit today.     The patient does not have symptoms concerning for COVID-19 infection (fever, chills, cough, or new shortness of breath).   She presents today for virtual visit. She prefers this method of contact due to COVID-19 pandemic.  Hypertension  This is a chronic problem. The current episode started more than 1 year ago. The problem has been gradually improving since onset. The problem is controlled. Pertinent negatives include no blurred vision, chest pain, palpitations or shortness of breath. The current treatment provides moderate  improvement.     Past Medical History:  Diagnosis Date  . Hypertension    Past Surgical History:  Procedure Laterality Date  . CESAREAN SECTION    . PARTIAL HYSTERECTOMY  2009  . TONSILLECTOMY  2004     Current Meds  Medication Sig  . amLODipine (NORVASC) 10 MG tablet Take 1 tablet (10 mg total) by mouth daily.  . Cholecalciferol (VITAMIN D3) 5000 units CAPS Take by mouth.  . Elastic Bandages & Supports (THUMB SPLINT/RIGHT SMALL) MISC 1 Units by Does not apply route daily.  . fluticasone (FLONASE) 50 MCG/ACT nasal spray Place 2 sprays into both nostrils daily.  Marland Kitchen lisinopril-hydrochlorothiazide (ZESTORETIC) 20-25 MG tablet Take 1 tablet by mouth daily.  . [DISCONTINUED] amLODipine (NORVASC) 10 MG tablet TAKE 1 TABLET DAILY  . [DISCONTINUED] fluticasone (FLONASE) 50 MCG/ACT nasal spray USE 2 SPRAYS IN EACH NOSTRIL DAILY  . [DISCONTINUED] lisinopril-hydrochlorothiazide (PRINZIDE,ZESTORETIC) 20-25 MG tablet TAKE 1 TABLET DAILY     Allergies:   Patient has no known allergies.   Social History   Tobacco Use  . Smoking status: Never Smoker  . Smokeless tobacco: Never Used  Substance Use Topics  . Alcohol use: Never    Frequency: Never  . Drug use: Never     Family Hx: The patient's family history includes COPD in her father; Diabetes in her mother; Hypertension in her father and mother; Hyperthyroidism in her mother; Kidney failure in her father.  ROS:   Please see the history of present illness.    Review of Systems  Constitutional: Negative.   HENT: Positive for  congestion.   Eyes: Negative for blurred vision.  Respiratory: Negative.  Negative for shortness of breath.   Cardiovascular: Negative.  Negative for chest pain and palpitations.  Gastrointestinal: Negative.   Neurological: Negative.   Psychiatric/Behavioral: Negative.     All other systems reviewed and are negative.   Labs/Other Tests and Data Reviewed:    Recent Labs: 12/12/2017: TSH 1.290 03/14/2018:  ALT 12; BUN 17; Creatinine, Ser 1.15; Hemoglobin 12.8; Platelets 293; Potassium 4.4; Sodium 142   Recent Lipid Panel Lab Results  Component Value Date/Time   CHOL 203 (H) 03/14/2018 11:55 AM   TRIG 76 03/14/2018 11:55 AM   HDL 63 03/14/2018 11:55 AM   CHOLHDL 3.2 03/14/2018 11:55 AM   LDLCALC 125 (H) 03/14/2018 11:55 AM    Wt Readings from Last 3 Encounters:  06/20/18 157 lb 8 oz (71.4 kg)  03/15/18 159 lb (72.1 kg)  03/14/18 159 lb 9.6 oz (72.4 kg)     Exam:    Vital Signs:  BP 123/70 Comment: unable to provide  Pulse 70 Comment: unable to provide  Ht 4' 10"  (1.473 m)   Wt 157 lb 8 oz (71.4 kg) Comment: pt provided  BMI 32.92 kg/m     Physical Exam  Constitutional: She is oriented to person, place, and time and well-developed, well-nourished, and in no distress.  HENT:  Head: Normocephalic and atraumatic.  Neck: Normal range of motion.  Pulmonary/Chest: Effort normal.  Neurological: She is alert and oriented to person, place, and time.  Psychiatric: Affect normal.  Nursing note and vitals reviewed.   ASSESSMENT & PLAN:     1. Essential hypertension  Chronic, yet well controlled. She will continue with current meds. She agrees to come in next week for bloodwork. She prefers to have blood drawn while in her car. I will check a BMP next week.   2. Renal insufficiency  Recent BMP results reviewed, from Jan 2020. GFR 57. She is advised that this is in CKD stage 3 range. She is also advised that she may have been dehydrated at that time. I will recheck this next week. She is encouraged to stay well hydrated.   3. Prediabetes  HER A1C HAS BEEN ELEVATED IN THE PAST. I WILL CHECK AN A1C, BMET TODAY. SHE WAS ENCOURAGED TO AVOID SUGARY BEVERAGES AND PROCESSED FOODS INCLUDNG BREADS, RICE AND PASTA.   4. Seasonal allergic rhinitis due to pollen  Chronic. She is encouraged to wear mask outside when exercising, gardening. She does not wish to take rx meds. She may use OTC  antihistamine or Flonase NS as needed. Marzetta Board is a homeopathic remedy that may also help with her symptoms.   5. Class 1 obesity due to excess calories without serious comorbidity with body mass index (BMI) of 32.0 to 32.9 in adult  Importance of achieving optimal weight to decrease risk of cardiovascular disease and cancers was discussed with the patient in full detail. She is encouraged to start slowly - start with 10 minutes twice daily at least three to four days per week and to gradually build to 30 minutes five days weekly. She was given tips to incorporate more activity into her daily routine - take stairs when possible, park farther away from her grocery stores, etc.      COVID-19 Education: The signs and symptoms of COVID-19 were discussed with the patient and how to seek care for testing (follow up with PCP or arrange E-visit).  The importance of social distancing was discussed today.  Patient Risk:   After full review of this patients clinical status, I feel that they are at least moderate risk at this time.  Time:   Today, I have spent 11 minutes with the patient with telehealth technology discussing above diagnoses.     Medication Adjustments/Labs and Tests Ordered: Current medicines are reviewed at length with the patient today.  Concerns regarding medicines are outlined above.   Tests Ordered: Orders Placed This Encounter  Procedures  . Hemoglobin A1c  . BMP8+EGFR    Medication Changes: No orders of the defined types were placed in this encounter.   Disposition:  Follow up in 4 month(s)  Signed, Maximino Greenland, MD

## 2018-06-25 ENCOUNTER — Other Ambulatory Visit: Payer: Medicare Other

## 2018-06-25 ENCOUNTER — Other Ambulatory Visit: Payer: Self-pay

## 2018-06-25 DIAGNOSIS — I10 Essential (primary) hypertension: Secondary | ICD-10-CM

## 2018-06-25 DIAGNOSIS — R7303 Prediabetes: Secondary | ICD-10-CM

## 2018-06-26 LAB — BMP8+EGFR
BUN/Creatinine Ratio: 12 (ref 12–28)
BUN: 14 mg/dL (ref 8–27)
CO2: 24 mmol/L (ref 20–29)
Calcium: 10.6 mg/dL — ABNORMAL HIGH (ref 8.7–10.3)
Chloride: 99 mmol/L (ref 96–106)
Creatinine, Ser: 1.17 mg/dL — ABNORMAL HIGH (ref 0.57–1.00)
GFR calc Af Amer: 55 mL/min/{1.73_m2} — ABNORMAL LOW (ref >59)
GFR calc non Af Amer: 48 mL/min/{1.73_m2} — ABNORMAL LOW (ref >59)
Glucose: 93 mg/dL (ref 65–99)
Potassium: 4.8 mmol/L (ref 3.5–5.2)
Sodium: 142 mmol/L (ref 134–144)

## 2018-06-26 LAB — HEMOGLOBIN A1C
Est. average glucose Bld gHb Est-mCnc: 128 mg/dL
Hgb A1c MFr Bld: 6.1 % — ABNORMAL HIGH (ref 4.8–5.6)

## 2018-07-01 ENCOUNTER — Other Ambulatory Visit: Payer: Self-pay

## 2018-07-01 ENCOUNTER — Other Ambulatory Visit: Payer: Medicare Other

## 2018-07-03 LAB — PROTEIN ELECTROPHORESIS, SERUM, WITH REFLEX
A/G Ratio: 1.1 (ref 0.7–1.7)
Albumin ELP: 3.8 g/dL (ref 2.9–4.4)
Alpha 1: 0.2 g/dL (ref 0.0–0.4)
Alpha 2: 0.8 g/dL (ref 0.4–1.0)
Beta: 1.1 g/dL (ref 0.7–1.3)
Gamma Globulin: 1.5 g/dL (ref 0.4–1.8)
Globulin, Total: 3.6 g/dL (ref 2.2–3.9)
Total Protein: 7.4 g/dL (ref 6.0–8.5)

## 2018-08-22 DIAGNOSIS — M654 Radial styloid tenosynovitis [de Quervain]: Secondary | ICD-10-CM | POA: Diagnosis not present

## 2018-12-18 ENCOUNTER — Ambulatory Visit (INDEPENDENT_AMBULATORY_CARE_PROVIDER_SITE_OTHER): Payer: Medicare Other

## 2018-12-18 ENCOUNTER — Ambulatory Visit (INDEPENDENT_AMBULATORY_CARE_PROVIDER_SITE_OTHER): Payer: Medicare Other | Admitting: Internal Medicine

## 2018-12-18 ENCOUNTER — Ambulatory Visit: Payer: Medicare Other | Admitting: Internal Medicine

## 2018-12-18 ENCOUNTER — Encounter: Payer: Self-pay | Admitting: Internal Medicine

## 2018-12-18 ENCOUNTER — Ambulatory Visit: Payer: Medicare Other

## 2018-12-18 ENCOUNTER — Other Ambulatory Visit: Payer: Self-pay

## 2018-12-18 VITALS — BP 134/74 | HR 74 | Temp 98.7°F | Ht 59.8 in | Wt 157.4 lb

## 2018-12-18 VITALS — BP 134/74 | HR 74 | Temp 98.7°F | Ht 59.0 in | Wt 157.0 lb

## 2018-12-18 DIAGNOSIS — Z6831 Body mass index (BMI) 31.0-31.9, adult: Secondary | ICD-10-CM | POA: Diagnosis not present

## 2018-12-18 DIAGNOSIS — Z23 Encounter for immunization: Secondary | ICD-10-CM

## 2018-12-18 DIAGNOSIS — N183 Chronic kidney disease, stage 3 unspecified: Secondary | ICD-10-CM | POA: Diagnosis not present

## 2018-12-18 DIAGNOSIS — I129 Hypertensive chronic kidney disease with stage 1 through stage 4 chronic kidney disease, or unspecified chronic kidney disease: Secondary | ICD-10-CM | POA: Diagnosis not present

## 2018-12-18 DIAGNOSIS — Z0000 Encounter for general adult medical examination without abnormal findings: Secondary | ICD-10-CM

## 2018-12-18 DIAGNOSIS — R7303 Prediabetes: Secondary | ICD-10-CM | POA: Diagnosis not present

## 2018-12-18 DIAGNOSIS — I10 Essential (primary) hypertension: Secondary | ICD-10-CM

## 2018-12-18 DIAGNOSIS — E6609 Other obesity due to excess calories: Secondary | ICD-10-CM | POA: Diagnosis not present

## 2018-12-18 DIAGNOSIS — E2839 Other primary ovarian failure: Secondary | ICD-10-CM | POA: Diagnosis not present

## 2018-12-18 LAB — POCT URINALYSIS DIPSTICK
Bilirubin, UA: NEGATIVE
Blood, UA: NEGATIVE
Glucose, UA: NEGATIVE
Ketones, UA: NEGATIVE
Leukocytes, UA: NEGATIVE
Nitrite, UA: NEGATIVE
Protein, UA: NEGATIVE
Spec Grav, UA: 1.015 (ref 1.010–1.025)
Urobilinogen, UA: 0.2 E.U./dL
pH, UA: 7 (ref 5.0–8.0)

## 2018-12-18 LAB — POCT UA - MICROALBUMIN
Albumin/Creatinine Ratio, Urine, POC: <30
Creatinine, POC: 100 mg/dL
Microalbumin Ur, POC: 10 mg/L

## 2018-12-18 MED ORDER — AMLODIPINE BESYLATE 10 MG PO TABS: 10.0000 mg | ORAL_TABLET | Freq: Every day | ORAL | 1 refills | Status: DC

## 2018-12-18 MED ORDER — LISINOPRIL-HYDROCHLOROTHIAZIDE 20-25 MG PO TABS: 1.0000 | ORAL_TABLET | Freq: Every day | ORAL | 1 refills | Status: DC

## 2018-12-18 MED ORDER — FLUTICASONE PROPIONATE 50 MCG/ACT NA SUSP: 2.0000 | Freq: Every day | NASAL | 1 refills | Status: DC

## 2018-12-18 NOTE — Progress Notes (Signed)
Subjective:   Alyssa Lee is a 68 y.o. female who presents for Medicare Annual (Subsequent) preventive examination.  Review of Systems:  n/a Cardiac Risk Factors include: advanced age (>67men, >61 women);hypertension;obesity (BMI >30kg/m2)     Objective:     Vitals: BP 134/74 (BP Location: Left Arm, Patient Position: Sitting, Cuff Size: Normal)   Pulse 74   Temp 98.7 F (37.1 C) (Oral)   Ht 4' 11.8" (1.519 m)   Wt 157 lb 6.4 oz (71.4 kg)   SpO2 99%   BMI 30.95 kg/m   Body mass index is 30.95 kg/m.  Advanced Directives 12/18/2018 12/12/2017  Does Patient Have a Medical Advance Directive? No No  Would patient like information on creating a medical advance directive? - Yes (MAU/Ambulatory/Procedural Areas - Information given)    Tobacco Social History   Tobacco Use  Smoking Status Never Smoker  Smokeless Tobacco Never Used     Counseling given: Not Answered   Clinical Intake:  Pre-visit preparation completed: Yes  Pain : No/denies pain     Nutritional Status: BMI > 30  Obese Diabetes: No  How often do you need to have someone help you when you read instructions, pamphlets, or other written materials from your doctor or pharmacy?: 1 - Never What is the last grade level you completed in school?: [employer]  Interpreter Needed?: No  Information entered by :: NAllen LPN  Past Medical History:  Diagnosis Date  . Hypertension    Past Surgical History:  Procedure Laterality Date  . CESAREAN SECTION    . PARTIAL HYSTERECTOMY  2009  . TONSILLECTOMY  2004   Family History  Problem Relation Age of Onset  . Hyperthyroidism Mother   . Hypertension Mother   . Diabetes Mother   . Hypertension Father   . COPD Father   . Kidney failure Father    Social History   Socioeconomic History  . Marital status: Married    Spouse name: Not on file  . Number of children: Not on file  . Years of education: Not on file  . Highest education level: Not on  file  Occupational History  . Occupation: retired  Scientific laboratory technician  . Financial resource strain: Not hard at all  . Food insecurity    Worry: Never true    Inability: Never true  . Transportation needs    Medical: No    Non-medical: No  Tobacco Use  . Smoking status: Never Smoker  . Smokeless tobacco: Never Used  Substance and Sexual Activity  . Alcohol use: Never    Frequency: Never  . Drug use: Never  . Sexual activity: Not Currently  Lifestyle  . Physical activity    Days per week: 0 days    Minutes per session: 0 min  . Stress: Not at all  Relationships  . Social Herbalist on phone: Not on file    Gets together: Not on file    Attends religious service: Not on file    Active member of club or organization: Not on file    Attends meetings of clubs or organizations: Not on file    Relationship status: Not on file  Other Topics Concern  . Not on file  Social History Narrative  . Not on file    Outpatient Encounter Medications as of 12/18/2018  Medication Sig  . amLODipine (NORVASC) 10 MG tablet Take 1 tablet (10 mg total) by mouth daily.  . Cholecalciferol (VITAMIN D3) 5000  units CAPS Take by mouth.  . Elastic Bandages & Supports (THUMB SPLINT/RIGHT SMALL) MISC 1 Units by Does not apply route daily.  . fluticasone (FLONASE) 50 MCG/ACT nasal spray Place 2 sprays into both nostrils daily.  Marland Kitchen lisinopril-hydrochlorothiazide (ZESTORETIC) 20-25 MG tablet Take 1 tablet by mouth daily.  . vitamin C (ASCORBIC ACID) 500 MG tablet Take 500 mg by mouth daily.   No facility-administered encounter medications on file as of 12/18/2018.     Activities of Daily Living In your present state of health, do you have any difficulty performing the following activities: 12/18/2018  Hearing? N  Vision? Y  Comment gets kind of blurry, dry eyes  Difficulty concentrating or making decisions? N  Walking or climbing stairs? N  Dressing or bathing? N  Doing errands, shopping? N   Preparing Food and eating ? N  Using the Toilet? N  In the past six months, have you accidently leaked urine? N  Do you have problems with loss of bowel control? N  Managing your Finances? N  Housekeeping or managing your Housekeeping? N  Some recent data might be hidden    Patient Care Team: Glendale Chard, MD as PCP - General (Internal Medicine)    Assessment:   This is a routine wellness examination for [name].  Exercise Activities and Dietary recommendations Current Exercise Habits: The patient does not participate in regular exercise at present  Goals    . Exercise 150 min/wk Moderate Activity (pt-stated)     Would like to exercise regularly    . Patient Stated     12/18/2018, would like to build muscle tone with bands and weights       Fall Risk Fall Risk  12/18/2018 06/20/2018 03/14/2018 12/12/2017 12/12/2017  Falls in the past year? 0 0 0 No No  Number falls in past yr: 0 - - - -  Risk for fall due to : Medication side effect - - Medication side effect -  Follow up Falls evaluation completed;Education provided;Falls prevention discussed - - - -   Is the patient's home free of loose throw rugs in walkways, pet beds, electrical cords, etc?   yes      Grab bars in the bathroom? no      Handrails on the stairs?   yes      Adequate lighting?   yes  Timed Get Up and Go performed: n/a  Depression Screen PHQ 2/9 Scores 12/18/2018 06/20/2018 03/14/2018 12/12/2017  PHQ - 2 Score 0 0 0 0  PHQ- 9 Score 0 - - 3     Cognitive Function     6CIT Screen 12/18/2018 12/12/2017  What Year? 0 points 0 points  What month? 0 points 0 points  What time? 0 points 0 points  Count back from 20 0 points -  Months in reverse 0 points 0 points  Repeat phrase 0 points 0 points  Total Score 0 -    Immunization History  Administered Date(s) Administered  . Influenza, High Dose Seasonal PF 12/12/2017, 12/18/2018    Qualifies for Shingles Vaccine? yes  Screening Tests Health  Maintenance  Topic Date Due  . Hepatitis C Screening  02-23-1950  . DEXA SCAN  04/02/2015  . MAMMOGRAM  12/18/2019 (Originally 04/01/2000)  . TETANUS/TDAP  12/18/2019 (Originally 04/01/1969)  . PNA vac Low Risk Adult (1 of 2 - PCV13) 12/18/2019 (Originally 04/02/2015)  . COLONOSCOPY  04/27/2027  . INFLUENZA VACCINE  Completed    Cancer Screenings: Lung: Low Dose CT  Chest recommended if Age 24-80 years, 30 pack-year currently smoking OR have quit w/in 15years. Patient does not qualify. Breast:  Up to date on Mammogram? No   Up to date of Bone Density/Dexa? No Colorectal: up to date  Additional Screenings: : Hepatitis C Screening:      Plan:    Patient would like to gain muscle tone with weights and bands.   I have personally reviewed and noted the following in the patient's chart:   . Medical and social history . Use of alcohol, tobacco or illicit drugs  . Current medications and supplements . Functional ability and status . Nutritional status . Physical activity . Advanced directives . List of other physicians . Hospitalizations, surgeries, and ER visits in previous 12 months . Vitals . Screenings to include cognitive, depression, and falls . Referrals and appointments  In addition, I have reviewed and discussed with patient certain preventive protocols, quality metrics, and best practice recommendations. A written personalized care plan for preventive services as well as general preventive health recommendations were provided to patient.     Kellie Simmering, LPN  [id number]

## 2018-12-18 NOTE — Addendum Note (Signed)
Addended by: Glenna Durand E on: 12/18/2018 11:39 AM   Modules accepted: Orders

## 2018-12-18 NOTE — Patient Instructions (Signed)
Bone Density Test The bone density test uses a special type of X-ray to measure the amount of calcium and other minerals in your bones. It can measure bone density in the hip and the spine. The test procedure is similar to having a regular X-ray. This test may also be called:  Bone densitometry.  Bone mineral density test.  Dual-energy X-ray absorptiometry (DEXA). You may have this test to:  Diagnose a condition that causes weak or thin bones (osteoporosis).  Screen you for osteoporosis.  Predict your risk for a broken bone (fracture).  Determine how well your osteoporosis treatment is working. Tell a health care provider about:  Any allergies you have.  All medicines you are taking, including vitamins, herbs, eye drops, creams, and over-the-counter medicines.  Any problems you or family members have had with anesthetic medicines.  Any blood disorders you have.  Any surgeries you have had.  Any medical conditions you have.  Whether you are pregnant or may be pregnant.  Any medical tests you have had within the past 14 days that used contrast material. What are the risks? Generally, this is a safe procedure. However, it does expose you to a small amount of radiation, which can slightly increase your cancer risk. What happens before the procedure?  Do not take any calcium supplements starting 24 hours before your test.  Remove all metal jewelry, eyeglasses, dental appliances, and any other metal objects. What happens during the procedure?   You will lie down on an exam table. There will be an X-ray generator below you and an imaging device above you.  Other devices, such as boxes or braces, may be used to position your body properly for the scan.  The machine will slowly scan your body. You will need to keep still.  The images will show up on a screen in the room. Images will be examined by a specialist after your test is done. The procedure may vary among health care  providers and hospitals. What happens after the procedure?  It is up to you to get your test results. Ask your health care provider, or the department that is doing the test, when your results will be ready. Summary  A bone density test is an imaging test that uses a type of X-ray to measure the amount of calcium and other minerals in your bones.  The test may be used to diagnose or screen you for a condition that causes weak or thin bones (osteoporosis), predict your risk for a broken bone (fracture), or determine how well your osteoporosis treatment is working.  Do not take any calcium supplements starting 24 hours before your test.  Ask your health care provider, or the department that is doing the test, when your results will be ready. This information is not intended to replace advice given to you by your health care provider. Make sure you discuss any questions you have with your health care provider. Document Released: 02/22/2004 Document Revised: 02/15/2017 Document Reviewed: 12/04/2016 Elsevier Patient Education  2020 [employer].

## 2018-12-18 NOTE — Patient Instructions (Signed)
Alyssa Lee , Thank you for taking time to come for your Medicare Wellness Visit. I appreciate your ongoing commitment to your health goals. Please review the following plan we discussed and let me know if I can assist you in the future.   Screening recommendations/referrals: Colonoscopy: 04/2017 Mammogram: decline at thi time Bone Density: decline at this time Recommended yearly ophthalmology/optometry visit for glaucoma screening and checkup Recommended yearly dental visit for hygiene and checkup  Vaccinations: Influenza vaccine: today Pneumococcal vaccine: decline Tdap vaccine: decline Shingles vaccine: discussed    Advanced directives: Advance directive discussed with you today. Even though you declined this today please call our office should you change your mind and we can give you the proper paperwork for you to fill out.   Conditions/risks identified: obesity  Next appointment: 12/18/2018   Preventive Care 20 Years and Older, Female Preventive care refers to lifestyle choices and visits with your health care provider that can promote health and wellness. What does preventive care include?  A yearly physical exam. This is also called an annual well check.  Dental exams once or twice a year.  Routine eye exams. Ask your health care provider how often you should have your eyes checked.  Personal lifestyle choices, including:  Daily care of your teeth and gums.  Regular physical activity.  Eating a healthy diet.  Avoiding tobacco and drug use.  Limiting alcohol use.  Practicing safe sex.  Taking low-dose aspirin every day.  Taking vitamin and mineral supplements as recommended by your health care provider. What happens during an annual well check? The services and screenings done by your health care provider during your annual well check will depend on your age, overall health, lifestyle risk factors, and family history of disease. Counseling  Your health care  provider may ask you questions about your:  Alcohol use.  Tobacco use.  Drug use.  Emotional well-being.  Home and relationship well-being.  Sexual activity.  Eating habits.  History of falls.  Memory and ability to understand (cognition).  Work and work Statistician.  Reproductive health. Screening  You may have the following tests or measurements:  Height, weight, and BMI.  Blood pressure.  Lipid and cholesterol levels. These may be checked every 5 years, or more frequently if you are over 33 years old.  Skin check.  Lung cancer screening. You may have this screening every year starting at age 20 if you have a 30-pack-year history of smoking and currently smoke or have quit within the past 15 years.  Fecal occult blood test (FOBT) of the stool. You may have this test every year starting at age 97.  Flexible sigmoidoscopy or colonoscopy. You may have a sigmoidoscopy every 5 years or a colonoscopy every 10 years starting at age 24.  Hepatitis C blood test.  Hepatitis B blood test.  Sexually transmitted disease (STD) testing.  Diabetes screening. This is done by checking your blood sugar (glucose) after you have not eaten for a while (fasting). You may have this done every 1-3 years.  Bone density scan. This is done to screen for osteoporosis. You may have this done starting at age 8.  Mammogram. This may be done every 1-2 years. Talk to your health care provider about how often you should have regular mammograms. Talk with your health care provider about your test results, treatment options, and if necessary, the need for more tests. Vaccines  Your health care provider may recommend certain vaccines, such as:  Influenza vaccine.  This is recommended every year.  Tetanus, diphtheria, and acellular pertussis (Tdap, Td) vaccine. You may need a Td booster every 10 years.  Zoster vaccine. You may need this after age 37.  Pneumococcal 13-valent conjugate (PCV13)  vaccine. One dose is recommended after age 44.  Pneumococcal polysaccharide (PPSV23) vaccine. One dose is recommended after age 69. Talk to your health care provider about which screenings and vaccines you need and how often you need them. This information is not intended to replace advice given to you by your health care provider. Make sure you discuss any questions you have with your health care provider. Document Released: 02/26/2015 Document Revised: 10/20/2015 Document Reviewed: 12/01/2014 Elsevier Interactive Patient Education  2017 [place] Prevention in the Home Falls can cause injuries. They can happen to people of all ages. There are many things you can do to make your home safe and to help prevent falls. What can I do on the outside of my home?  Regularly fix the edges of walkways and driveways and fix any cracks.  Remove anything that might make you trip as you walk through a door, such as a raised step or threshold.  Trim any bushes or trees on the path to your home.  Use bright outdoor lighting.  Clear any walking paths of anything that might make someone trip, such as rocks or tools.  Regularly check to see if handrails are loose or broken. Make sure that both sides of any steps have handrails.  Any raised decks and porches should have guardrails on the edges.  Have any leaves, snow, or ice cleared regularly.  Use sand or salt on walking paths during winter.  Clean up any spills in your garage right away. This includes oil or grease spills. What can I do in the bathroom?  Use night lights.  Install grab bars by the toilet and in the tub and shower. Do not use towel bars as grab bars.  Use non-skid mats or decals in the tub or shower.  If you need to sit down in the shower, use a plastic, non-slip stool.  Keep the floor dry. Clean up any water that spills on the floor as soon as it happens.  Remove soap buildup in the tub or shower regularly.   Attach bath mats securely with double-sided non-slip rug tape.  Do not have throw rugs and other things on the floor that can make you trip. What can I do in the bedroom?  Use night lights.  Make sure that you have a light by your bed that is easy to reach.  Do not use any sheets or blankets that are too big for your bed. They should not hang down onto the floor.  Have a firm chair that has side arms. You can use this for support while you get dressed.  Do not have throw rugs and other things on the floor that can make you trip. What can I do in the kitchen?  Clean up any spills right away.  Avoid walking on wet floors.  Keep items that you use a lot in easy-to-reach places.  If you need to reach something above you, use a strong step stool that has a grab bar.  Keep electrical cords out of the way.  Do not use floor polish or wax that makes floors slippery. If you must use wax, use non-skid floor wax.  Do not have throw rugs and other things on the floor that can make  you trip. What can I do with my stairs?  Do not leave any items on the stairs.  Make sure that there are handrails on both sides of the stairs and use them. Fix handrails that are broken or loose. Make sure that handrails are as long as the stairways.  Check any carpeting to make sure that it is firmly attached to the stairs. Fix any carpet that is loose or worn.  Avoid having throw rugs at the top or bottom of the stairs. If you do have throw rugs, attach them to the floor with carpet tape.  Make sure that you have a light switch at the top of the stairs and the bottom of the stairs. If you do not have them, ask someone to add them for you. What else can I do to help prevent falls?  Wear shoes that:  Do not have high heels.  Have rubber bottoms.  Are comfortable and fit you well.  Are closed at the toe. Do not wear sandals.  If you use a stepladder:  Make sure that it is fully opened. Do not climb  a closed stepladder.  Make sure that both sides of the stepladder are locked into place.  Ask someone to hold it for you, if possible.  Clearly mark and make sure that you can see:  Any grab bars or handrails.  First and last steps.  Where the edge of each step is.  Use tools that help you move around (mobility aids) if they are needed. These include:  Canes.  Walkers.  Scooters.  Crutches.  Turn on the lights when you go into a dark area. Replace any light bulbs as soon as they burn out.  Set up your furniture so you have a clear path. Avoid moving your furniture around.  If any of your floors are uneven, fix them.  If there are any pets around you, be aware of where they are.  Review your medicines with your doctor. Some medicines can make you feel dizzy. This can increase your chance of falling. Ask your doctor what other things that you can do to help prevent falls. This information is not intended to replace advice given to you by your health care provider. Make sure you discuss any questions you have with your health care provider. Document Released: 11/26/2008 Document Revised: 07/08/2015 Document Reviewed: 03/06/2014 Elsevier Interactive Patient Education  2017 [employer].

## 2018-12-19 LAB — CMP14+EGFR
ALT: 12 IU/L (ref 0–32)
AST: 13 IU/L (ref 0–40)
Albumin/Globulin Ratio: 1.4 (ref 1.2–2.2)
Albumin: 4.7 g/dL (ref 3.8–4.8)
Alkaline Phosphatase: 49 IU/L (ref 39–117)
BUN/Creatinine Ratio: 12 (ref 12–28)
BUN: 13 mg/dL (ref 8–27)
Bilirubin Total: 0.3 mg/dL (ref 0.0–1.2)
CO2: 25 mmol/L (ref 20–29)
Calcium: 10.6 mg/dL — ABNORMAL HIGH (ref 8.7–10.3)
Chloride: 100 mmol/L (ref 96–106)
Creatinine, Ser: 1.09 mg/dL — ABNORMAL HIGH (ref 0.57–1.00)
GFR calc Af Amer: 60 mL/min/{1.73_m2} (ref >59)
GFR calc non Af Amer: 52 mL/min/{1.73_m2} — ABNORMAL LOW (ref >59)
Globulin, Total: 3.3 g/dL (ref 1.5–4.5)
Glucose: 79 mg/dL (ref 65–99)
Potassium: 4.6 mmol/L (ref 3.5–5.2)
Sodium: 141 mmol/L (ref 134–144)
Total Protein: 8 g/dL (ref 6.0–8.5)

## 2018-12-19 LAB — LIPID PANEL
Chol/HDL Ratio: 3.4 ratio (ref 0.0–4.4)
Cholesterol, Total: 210 mg/dL — ABNORMAL HIGH (ref 100–199)
HDL: 62 mg/dL (ref >39)
LDL Chol Calc (NIH): 133 mg/dL — ABNORMAL HIGH (ref 0–99)
Triglycerides: 85 mg/dL (ref 0–149)
VLDL Cholesterol Cal: 15 mg/dL (ref 5–40)

## 2018-12-19 LAB — HEPATITIS C ANTIBODY: Hep C Virus Ab: <0.1 s/co ratio (ref 0.0–0.9)

## 2018-12-19 LAB — HEMOGLOBIN A1C
Est. average glucose Bld gHb Est-mCnc: 128 mg/dL
Hgb A1c MFr Bld: 6.1 % — ABNORMAL HIGH (ref 4.8–5.6)

## 2018-12-22 NOTE — Progress Notes (Signed)
Subjective:     Patient ID: Alyssa Lee , female    DOB: 03-20-1950 , 68 y.o.   MRN: [id number]   Chief Complaint  Patient presents with  . Hypertension    HPI  Hypertension This is a chronic problem. The current episode started more than 1 year ago. The problem has been gradually improving since onset. The problem is controlled. Pertinent negatives include no blurred vision, chest pain, palpitations or shortness of breath. Past treatments include diuretics and ACE inhibitors. The current treatment provides moderate improvement. Hypertensive end-organ damage includes kidney disease.     Past Medical History:  Diagnosis Date  . Hypertension      Family History  Problem Relation Age of Onset  . Hyperthyroidism Mother   . Hypertension Mother   . Diabetes Mother   . Hypertension Father   . COPD Father   . Kidney failure Father      Current Outpatient Medications:  .  amLODipine (NORVASC) 10 MG tablet, Take 1 tablet (10 mg total) by mouth daily., Disp: 90 tablet, Rfl: 1 .  Cholecalciferol (VITAMIN D3) 5000 units CAPS, Take by mouth., Disp: , Rfl:  .  Elastic Bandages & Supports (THUMB SPLINT/RIGHT SMALL) MISC, 1 Units by Does not apply route daily., Disp: 1 each, Rfl: 0 .  fluticasone (FLONASE) 50 MCG/ACT nasal spray, Place 2 sprays into both nostrils daily., Disp: 48 g, Rfl: 1 .  lisinopril-hydrochlorothiazide (ZESTORETIC) 20-25 MG tablet, Take 1 tablet by mouth daily., Disp: 90 tablet, Rfl: 1 .  vitamin C (ASCORBIC ACID) 500 MG tablet, Take 500 mg by mouth daily., Disp: , Rfl:    No Known Allergies   Review of Systems  Constitutional: Negative.   Eyes: Negative for blurred vision.  Respiratory: Negative.  Negative for shortness of breath.   Cardiovascular: Negative.  Negative for chest pain and palpitations.  Gastrointestinal: Negative.   Neurological: Negative.   Psychiatric/Behavioral: Negative.      Today's Vitals   12/18/18 1110  BP: 134/74  Pulse: 74   Temp: 98.7 F (37.1 C)  TempSrc: Oral  Weight: 157 lb (71.2 kg)  Height: 4' 11"  (1.499 m)   Body mass index is 31.71 kg/m.   Objective:  Physical Exam Vitals signs and nursing note reviewed.  Constitutional:      Appearance: Normal appearance.  HENT:     Head: Normocephalic and atraumatic.  Cardiovascular:     Rate and Rhythm: Normal rate and regular rhythm.     Heart sounds: Normal heart sounds.  Pulmonary:     Effort: Pulmonary effort is normal.     Breath sounds: Normal breath sounds.  Skin:    General: Skin is warm.  Neurological:     General: No focal deficit present.     Mental Status: She is alert.  Psychiatric:        Mood and Affect: Mood normal.        Behavior: Behavior normal.         Assessment And Plan:     1. Hypertensive nephropathy  Chronic, fair control. She will continue with current meds. She is encouraged to avoid adding salt to her foods. I will check labs as listed below. She will rto in six months for re-evaluation.   - Hepatitis C antibody - CMP14+EGFR - Lipid panel  2. Stage 3 chronic kidney disease, unspecified whether stage 3a or 3b CKD  Chronic, yet stable. She is encouraged to stay well hydrated.   3. Prediabetes  HER  A1C HAS BEEN ELEVATED IN THE PAST. I WILL CHECK AN A1C, BMET TODAY. SHE WAS ENCOURAGED TO AVOID SUGARY BEVERAGES AND PROCESSED FOODS INCLUDNG BREADS, RICE AND PASTA.  - Hemoglobin A1c  4. Estrogen deficiency  She refuses to get Dexa Scan at this time due to pandemic. She is encouraged to engage in weight-bearing exercises at least four days per week, and to continue with calcium and vitamin D supplementation.   5. Class 1 obesity due to excess calories with serious comorbidity and body mass index (BMI) of 31.0 to 31.9 in adult  BMI 31. She is encouraged to lose 7-10 pounds to decrease cardiac risk.   Maximino Greenland, MD    THE PATIENT IS ENCOURAGED TO PRACTICE SOCIAL DISTANCING DUE TO THE COVID-19 PANDEMIC.

## 2019-03-03 ENCOUNTER — Telehealth: Payer: Self-pay

## 2019-03-03 NOTE — Telephone Encounter (Signed)
Pt called and wanted lab results from last visit pt does not know how to use mychart

## 2019-06-16 ENCOUNTER — Other Ambulatory Visit: Payer: Self-pay | Admitting: Internal Medicine

## 2019-06-17 ENCOUNTER — Ambulatory Visit (INDEPENDENT_AMBULATORY_CARE_PROVIDER_SITE_OTHER): Payer: Medicare HMO | Admitting: Internal Medicine

## 2019-06-17 ENCOUNTER — Other Ambulatory Visit: Payer: Self-pay

## 2019-06-17 ENCOUNTER — Encounter: Payer: Self-pay | Admitting: Internal Medicine

## 2019-06-17 VITALS — BP 126/78 | HR 61 | Temp 98.1°F | Ht 59.0 in | Wt 157.0 lb

## 2019-06-17 DIAGNOSIS — R7309 Other abnormal glucose: Secondary | ICD-10-CM | POA: Diagnosis not present

## 2019-06-17 DIAGNOSIS — I129 Hypertensive chronic kidney disease with stage 1 through stage 4 chronic kidney disease, or unspecified chronic kidney disease: Secondary | ICD-10-CM

## 2019-06-17 DIAGNOSIS — E66811 Obesity, class 1: Secondary | ICD-10-CM

## 2019-06-17 DIAGNOSIS — E559 Vitamin D deficiency, unspecified: Secondary | ICD-10-CM

## 2019-06-17 DIAGNOSIS — Z6831 Body mass index (BMI) 31.0-31.9, adult: Secondary | ICD-10-CM

## 2019-06-17 DIAGNOSIS — N183 Chronic kidney disease, stage 3 unspecified: Secondary | ICD-10-CM

## 2019-06-17 DIAGNOSIS — E2839 Other primary ovarian failure: Secondary | ICD-10-CM

## 2019-06-17 DIAGNOSIS — E6609 Other obesity due to excess calories: Secondary | ICD-10-CM

## 2019-06-17 DIAGNOSIS — Z1231 Encounter for screening mammogram for malignant neoplasm of breast: Secondary | ICD-10-CM

## 2019-06-17 MED ORDER — AMLODIPINE BESYLATE 10 MG PO TABS: 10.0000 mg | ORAL_TABLET | Freq: Every day | ORAL | 2 refills | Status: DC

## 2019-06-17 MED ORDER — LISINOPRIL-HYDROCHLOROTHIAZIDE 20-25 MG PO TABS: 1.0000 | ORAL_TABLET | Freq: Every day | ORAL | 2 refills | Status: DC

## 2019-06-17 NOTE — Patient Instructions (Signed)
Bone Density Test The bone density test uses a special type of X-ray to measure the amount of calcium and other minerals in your bones. It can measure bone density in the hip and the spine. The test procedure is similar to having a regular X-ray. This test may also be called:  Bone densitometry.  Bone mineral density test.  Dual-energy X-ray absorptiometry (DEXA). You may have this test to:  Diagnose a condition that causes weak or thin bones (osteoporosis).  Screen you for osteoporosis.  Predict your risk for a broken bone (fracture).  Determine how well your osteoporosis treatment is working. Tell a health care provider about:  Any allergies you have.  All medicines you are taking, including vitamins, herbs, eye drops, creams, and over-the-counter medicines.  Any problems you or family members have had with anesthetic medicines.  Any blood disorders you have.  Any surgeries you have had.  Any medical conditions you have.  Whether you are pregnant or may be pregnant.  Any medical tests you have had within the past 14 days that used contrast material. What are the risks? Generally, this is a safe procedure. However, it does expose you to a small amount of radiation, which can slightly increase your cancer risk. What happens before the procedure?  Do not take any calcium supplements starting 24 hours before your test.  Remove all metal jewelry, eyeglasses, dental appliances, and any other metal objects. What happens during the procedure?   You will lie down on an exam table. There will be an X-ray generator below you and an imaging device above you.  Other devices, such as boxes or braces, may be used to position your body properly for the scan.  The machine will slowly scan your body. You will need to keep still.  The images will show up on a screen in the room. Images will be examined by a specialist after your test is done. The procedure may vary among health care  providers and hospitals. What happens after the procedure?  It is up to you to get your test results. Ask your health care provider, or the department that is doing the test, when your results will be ready. Summary  A bone density test is an imaging test that uses a type of X-ray to measure the amount of calcium and other minerals in your bones.  The test may be used to diagnose or screen you for a condition that causes weak or thin bones (osteoporosis), predict your risk for a broken bone (fracture), or determine how well your osteoporosis treatment is working.  Do not take any calcium supplements starting 24 hours before your test.  Ask your health care provider, or the department that is doing the test, when your results will be ready. This information is not intended to replace advice given to you by your health care provider. Make sure you discuss any questions you have with your health care provider. Document Revised: 02/15/2017 Document Reviewed: 12/04/2016 Elsevier Patient Education  2020 [employer] Inc[employer]  

## 2019-06-17 NOTE — Progress Notes (Signed)
This visit occurred during the SARS-CoV-2 public health emergency.  Safety protocols were in place, including screening questions prior to the visit, additional usage of staff PPE, and extensive cleaning of exam room while observing appropriate contact time as indicated for disinfecting solutions.  Subjective:     Patient ID: Alyssa Lee , female    DOB: August 19, 1950 , 69 y.o.   MRN: [id number]   Chief Complaint  Patient presents with  . Hypertension    HPI  She is here today for a BP check.  She reports compliance with meds. She does not offer any concerns/complaints at this time.   Hypertension This is a chronic problem. The current episode started more than 1 year ago. The problem has been gradually improving since onset. The problem is controlled. Pertinent negatives include no blurred vision, chest pain, palpitations or shortness of breath. Past treatments include diuretics and ACE inhibitors. The current treatment provides moderate improvement. Hypertensive end-organ damage includes kidney disease.     Past Medical History:  Diagnosis Date  . Hypertension      Family History  Problem Relation Age of Onset  . Hyperthyroidism Mother   . Hypertension Mother   . Diabetes Mother   . Hypertension Father   . COPD Father   . Kidney failure Father      Current Outpatient Medications:  .  amLODipine (NORVASC) 10 MG tablet, Take 1 tablet (10 mg total) by mouth daily., Disp: 90 tablet, Rfl: 1 .  Cholecalciferol (VITAMIN D3) 5000 units CAPS, Take by mouth., Disp: , Rfl:  .  Elastic Bandages & Supports (THUMB SPLINT/RIGHT SMALL) MISC, 1 Units by Does not apply route daily., Disp: 1 each, Rfl: 0 .  fluticasone (FLONASE) 50 MCG/ACT nasal spray, USE 2 SPRAYS IN EACH NOSTRIL DAILY, Disp: 48 g, Rfl: 3 .  lisinopril-hydrochlorothiazide (ZESTORETIC) 20-25 MG tablet, Take 1 tablet by mouth daily., Disp: 90 tablet, Rfl: 1 .  vitamin C (ASCORBIC ACID) 500 MG tablet, Take 500 mg by mouth  daily., Disp: , Rfl:    No Known Allergies   Review of Systems  Constitutional: Negative.   Eyes: Negative for blurred vision.  Respiratory: Negative.  Negative for shortness of breath.   Cardiovascular: Negative.  Negative for chest pain and palpitations.  Gastrointestinal: Negative.   Neurological: Negative.   Psychiatric/Behavioral: Negative.      Today's Vitals   06/17/19 1003  BP: 126/78  Pulse: 61  Temp: 98.1 F (36.7 C)  TempSrc: Oral  SpO2: 96%  Weight: 157 lb (71.2 kg)  Height: 4' 11"  (1.499 m)   Body mass index is 31.71 kg/m.   Objective:  Physical Exam Vitals and nursing note reviewed.  Constitutional:      Appearance: Normal appearance. She is obese.  HENT:     Head: Normocephalic and atraumatic.  Cardiovascular:     Rate and Rhythm: Normal rate and regular rhythm.     Heart sounds: Normal heart sounds.  Pulmonary:     Effort: Pulmonary effort is normal.     Breath sounds: Normal breath sounds.  Skin:    General: Skin is warm.  Neurological:     General: No focal deficit present.     Mental Status: She is alert.  Psychiatric:        Mood and Affect: Mood normal.        Behavior: Behavior normal.         Assessment And Plan:     1. Hypertensive nephropathy  Chronic, well  controlled. She will continue with current meds. She is encouraged to avoid adding salt to her foods. I will check renal function today.   - CMP14+EGFR  2. Stage 3 chronic kidney disease, unspecified whether stage 3a or 3b CKD  Chronic. However, her most recent renal function was in stage 2 range. I will update to stage 2 if this is maintained.   3. Other abnormal glucose  HER A1C HAS BEEN ELEVATED IN THE PAST. I WILL CHECK AN A1C, BMET TODAY. SHE WAS ENCOURAGED TO AVOID SUGARY BEVERAGES AND PROCESSED FOODS INCLUDNG BREADS, RICE AND PASTA.  - Hemoglobin A1c  4. Estrogen deficiency  She is due for dexa scan, she declines at this time. She is encouraged to participate  in weight-bearing exercises at least 3 days weekly. She should also continue with calcium and vitamin D supplementation .  5. Vitamin D deficiency  I WILL CHECK A VIT D LEVEL AND SUPPLEMENT AS NEEDED.  ALSO ENCOURAGED TO SPEND 15 MINUTES IN THE SUN DAILY.  - Vitamin D (25 hydroxy)  6. Encounter for screening mammogram for malignant neoplasm of breast  She declines mammogram at this time. She is now overdue for this. I did express my concern for delaying this further, she reports "she is feeling fine".   7. Class 1 obesity due to excess calories with serious comorbidity and body mass index (BMI) of 31.0 to 31.9 in adult  She is encouraged to strive for BMI less than 29 to decrease cardiac risk. Advised to exercise at least 30 minutes five days weekly.   Maximino Greenland, MD    THE PATIENT IS ENCOURAGED TO PRACTICE SOCIAL DISTANCING DUE TO THE COVID-19 PANDEMIC.

## 2019-06-18 LAB — CMP14+EGFR
ALT: 14 IU/L (ref 0–32)
AST: 13 IU/L (ref 0–40)
Albumin/Globulin Ratio: 1.5 (ref 1.2–2.2)
Albumin: 4.4 g/dL (ref 3.8–4.8)
Alkaline Phosphatase: 49 IU/L (ref 39–117)
BUN/Creatinine Ratio: 12 (ref 12–28)
BUN: 12 mg/dL (ref 8–27)
Bilirubin Total: 0.4 mg/dL (ref 0.0–1.2)
CO2: 25 mmol/L (ref 20–29)
Calcium: 9.8 mg/dL (ref 8.7–10.3)
Chloride: 102 mmol/L (ref 96–106)
Creatinine, Ser: 1.01 mg/dL — ABNORMAL HIGH (ref 0.57–1.00)
GFR calc Af Amer: 66 mL/min/1.73
GFR calc non Af Amer: 57 mL/min/1.73 — ABNORMAL LOW
Globulin, Total: 2.9 g/dL (ref 1.5–4.5)
Glucose: 84 mg/dL (ref 65–99)
Potassium: 3.8 mmol/L (ref 3.5–5.2)
Sodium: 141 mmol/L (ref 134–144)
Total Protein: 7.3 g/dL (ref 6.0–8.5)

## 2019-06-18 LAB — HEMOGLOBIN A1C
Est. average glucose Bld gHb Est-mCnc: 126 mg/dL
Hgb A1c MFr Bld: 6 % — ABNORMAL HIGH (ref 4.8–5.6)

## 2019-06-18 LAB — VITAMIN D 25 HYDROXY (VIT D DEFICIENCY, FRACTURES): Vit D, 25-Hydroxy: 80.7 ng/mL (ref 30.0–100.0)

## 2019-07-31 ENCOUNTER — Ambulatory Visit (INDEPENDENT_AMBULATORY_CARE_PROVIDER_SITE_OTHER): Payer: Medicare HMO | Admitting: Internal Medicine

## 2019-07-31 ENCOUNTER — Encounter: Payer: Self-pay | Admitting: Internal Medicine

## 2019-07-31 ENCOUNTER — Other Ambulatory Visit: Payer: Self-pay

## 2019-07-31 ENCOUNTER — Ambulatory Visit: Payer: Medicare HMO

## 2019-07-31 VITALS — BP 114/62 | HR 70 | Temp 98.4°F | Ht 58.2 in | Wt 153.6 lb

## 2019-07-31 DIAGNOSIS — R1031 Right lower quadrant pain: Secondary | ICD-10-CM

## 2019-07-31 DIAGNOSIS — E2839 Other primary ovarian failure: Secondary | ICD-10-CM

## 2019-07-31 DIAGNOSIS — R5383 Other fatigue: Secondary | ICD-10-CM | POA: Diagnosis not present

## 2019-07-31 DIAGNOSIS — R350 Frequency of micturition: Secondary | ICD-10-CM | POA: Diagnosis not present

## 2019-07-31 LAB — POCT URINALYSIS DIPSTICK
Bilirubin, UA: NEGATIVE
Blood, UA: NEGATIVE
Glucose, UA: NEGATIVE
Ketones, UA: NEGATIVE
Leukocytes, UA: NEGATIVE
Nitrite, UA: NEGATIVE
Protein, UA: NEGATIVE
Spec Grav, UA: 1.025 (ref 1.010–1.025)
Urobilinogen, UA: 1 E.U./dL
pH, UA: 6 (ref 5.0–8.0)

## 2019-07-31 NOTE — Progress Notes (Signed)
This visit occurred during the SARS-CoV-2 public health emergency.  Safety protocols were in place, including screening questions prior to the visit, additional usage of staff PPE, and extensive cleaning of exam room while observing appropriate contact time as indicated for disinfecting solutions.  Subjective:     Patient ID: Alyssa Lee , female    DOB: July 27, 1950 , 69 y.o.   MRN: [id number]   Chief Complaint  Patient presents with  . Urinary Tract Infection    HPI  She is here today for further evaluation of urinary frequency and fatigue.  She reports one of her crowns came off about two weeks ago. She then became fatigued and tired. She was able to get her crown fixed on yesterday by Dr. Claudina Lick. She is now starting to feel better. She developed urinary frequency 2 days ago. She then started probiotics w/ prebiotics and cranberry.  She notes that her sx have improved.   Urinary Tract Infection  This is a new problem. The problem occurs every urination. The problem has been gradually improving.     Past Medical History:  Diagnosis Date  . Hypertension      Family History  Problem Relation Age of Onset  . Hyperthyroidism Mother   . Hypertension Mother   . Diabetes Mother   . Hypertension Father   . COPD Father   . Kidney failure Father      Current Outpatient Medications:  .  amLODipine (NORVASC) 10 MG tablet, Take 1 tablet (10 mg total) by mouth daily., Disp: 90 tablet, Rfl: 2 .  Cholecalciferol (VITAMIN D3) 5000 units CAPS, Take by mouth., Disp: , Rfl:  .  Elastic Bandages & Supports (THUMB SPLINT/RIGHT SMALL) MISC, 1 Units by Does not apply route daily., Disp: 1 each, Rfl: 0 .  fluticasone (FLONASE) 50 MCG/ACT nasal spray, USE 2 SPRAYS IN EACH NOSTRIL DAILY, Disp: 48 g, Rfl: 3 .  lisinopril-hydrochlorothiazide (ZESTORETIC) 20-25 MG tablet, Take 1 tablet by mouth daily., Disp: 90 tablet, Rfl: 2 .  OVER THE COUNTER MEDICATION, Probiotic with cranberry, Disp: ,  Rfl:  .  vitamin C (ASCORBIC ACID) 500 MG tablet, Take 500 mg by mouth daily., Disp: , Rfl:    No Known Allergies   Review of Systems  Constitutional: Positive for fatigue.  HENT: Positive for dental problem.   Respiratory: Negative.   Cardiovascular: Negative.   Gastrointestinal: Positive for abdominal pain.       She c/o right - sided abdominal pain. Denies fall/trauma. There is some associated gas. She started to take probiotics and her sx have improved.   Neurological: Negative.   Psychiatric/Behavioral: Negative.      Today's Vitals   07/31/19 1520  BP: 114/62  Pulse: 70  Temp: 98.4 F (36.9 C)  TempSrc: Oral  Weight: 153 lb 9.6 oz (69.7 kg)  Height: 4' 10.2" (1.478 m)  PainSc: 5   PainLoc: Abdomen   Body mass index is 31.88 kg/m.   Objective:  Physical Exam Vitals and nursing note reviewed.  Constitutional:      Appearance: Normal appearance.  HENT:     Head: Normocephalic and atraumatic.  Cardiovascular:     Rate and Rhythm: Normal rate and regular rhythm.     Heart sounds: Normal heart sounds.  Pulmonary:     Effort: Pulmonary effort is normal.     Breath sounds: Normal breath sounds.  Abdominal:     General: Bowel sounds are normal. There is no distension.     Palpations:  Abdomen is soft.  Skin:    General: Skin is warm.  Neurological:     General: No focal deficit present.     Mental Status: She is alert.  Psychiatric:        Mood and Affect: Mood normal.        Behavior: Behavior normal.         Assessment And Plan:     1. Frequency of urination  I will check urinalysis today. I will make further recommendations once results are available. If needed, I will send off urine culture.   - POCT Urinalysis Dipstick (81002)  2. Fatigue, unspecified type  Improved. She is encouraged to stay well hydrated.   3. RLQ abdominal pain  Improved with use of probiotics. She is encouraged to continue with probiotic usage.   4. Estrogen  deficiency  She declines bone density AND mammogram at this time, due to [place] pandemic. She is fully vaccinated.  Encouraged to walk 30 minutes four to five days per week and continue with calcium/vitamin D supplementation.    Maximino Greenland, MD    THE PATIENT IS ENCOURAGED TO PRACTICE SOCIAL DISTANCING DUE TO THE COVID-19 PANDEMIC.

## 2019-12-24 ENCOUNTER — Encounter: Payer: Self-pay | Admitting: Internal Medicine

## 2019-12-24 ENCOUNTER — Ambulatory Visit: Payer: Medicare Other

## 2019-12-24 ENCOUNTER — Ambulatory Visit: Payer: Medicare Other | Admitting: Internal Medicine

## 2019-12-24 ENCOUNTER — Ambulatory Visit (INDEPENDENT_AMBULATORY_CARE_PROVIDER_SITE_OTHER): Payer: Medicare HMO | Admitting: Internal Medicine

## 2019-12-24 ENCOUNTER — Other Ambulatory Visit: Payer: Self-pay

## 2019-12-24 ENCOUNTER — Ambulatory Visit (INDEPENDENT_AMBULATORY_CARE_PROVIDER_SITE_OTHER): Payer: Medicare HMO

## 2019-12-24 ENCOUNTER — Ambulatory Visit: Payer: Medicare HMO

## 2019-12-24 VITALS — BP 124/78 | HR 69 | Temp 98.7°F | Ht <= 58 in | Wt 157.2 lb

## 2019-12-24 VITALS — BP 124/78 | HR 69 | Temp 97.8°F | Ht <= 58 in | Wt 157.0 lb

## 2019-12-24 DIAGNOSIS — I129 Hypertensive chronic kidney disease with stage 1 through stage 4 chronic kidney disease, or unspecified chronic kidney disease: Secondary | ICD-10-CM

## 2019-12-24 DIAGNOSIS — N183 Chronic kidney disease, stage 3 unspecified: Secondary | ICD-10-CM | POA: Diagnosis not present

## 2019-12-24 DIAGNOSIS — R609 Edema, unspecified: Secondary | ICD-10-CM | POA: Diagnosis not present

## 2019-12-24 DIAGNOSIS — E559 Vitamin D deficiency, unspecified: Secondary | ICD-10-CM

## 2019-12-24 DIAGNOSIS — Z0000 Encounter for general adult medical examination without abnormal findings: Secondary | ICD-10-CM | POA: Diagnosis not present

## 2019-12-24 DIAGNOSIS — R7309 Other abnormal glucose: Secondary | ICD-10-CM

## 2019-12-24 DIAGNOSIS — E2839 Other primary ovarian failure: Secondary | ICD-10-CM

## 2019-12-24 DIAGNOSIS — Z6832 Body mass index (BMI) 32.0-32.9, adult: Secondary | ICD-10-CM

## 2019-12-24 DIAGNOSIS — E6609 Other obesity due to excess calories: Secondary | ICD-10-CM

## 2019-12-24 LAB — POCT URINALYSIS DIPSTICK
Bilirubin, UA: NEGATIVE
Blood, UA: NEGATIVE
Glucose, UA: NEGATIVE
Ketones, UA: NEGATIVE
Leukocytes, UA: NEGATIVE
Nitrite, UA: NEGATIVE
Protein, UA: NEGATIVE
Spec Grav, UA: >=1.03 — AB (ref 1.010–1.025)
Urobilinogen, UA: 0.2 E.U./dL
pH, UA: 6.5 (ref 5.0–8.0)

## 2019-12-24 LAB — POCT UA - MICROALBUMIN
Albumin/Creatinine Ratio, Urine, POC: <30
Creatinine, POC: 200 mg/dL
Microalbumin Ur, POC: 10 mg/L

## 2019-12-24 NOTE — Progress Notes (Signed)
I,Katawbba Wiggins,acting as a Education administrator for Maximino Greenland, MD.,have documented all relevant documentation on the behalf of Maximino Greenland, MD,as directed by  Maximino Greenland, MD while in the presence of Maximino Greenland, MD.  This visit occurred during the SARS-CoV-2 public health emergency.  Safety protocols were in place, including screening questions prior to the visit, additional usage of staff PPE, and extensive cleaning of exam room while observing appropriate contact time as indicated for disinfecting solutions.  Subjective:     Patient ID: Alyssa Lee , female    DOB: Mar 20, 1950 , 69 y.o.   MRN: [id number]   Chief Complaint  Patient presents with  . Annual Exam  . Hypertension    HPI  The patient is here today for a physical examination. She is no longer followed by GYN. She has no specific concerns or complaints at this time. She denies chest pain, palpitations and shortness of breath. Reports she walks on dog on a regular basis - this is her primary form of exercise.She is not yet ready to return to the gym due to [place].   Hypertension This is a chronic problem. The current episode started more than 1 year ago. The problem has been gradually improving since onset. The problem is controlled. Pertinent negatives include no blurred vision, chest pain, palpitations or shortness of breath. The current treatment provides moderate improvement.     Past Medical History:  Diagnosis Date  . Hypertension      Family History  Problem Relation Age of Onset  . Hyperthyroidism Mother   . Hypertension Mother   . Diabetes Mother   . Hypertension Father   . COPD Father   . Kidney failure Father      Current Outpatient Medications:  .  amLODipine (NORVASC) 10 MG tablet, Take 1 tablet (10 mg total) by mouth daily., Disp: 90 tablet, Rfl: 2 .  Cholecalciferol (VITAMIN D3) 5000 units CAPS, Take by mouth., Disp: , Rfl:  .  Elastic Bandages & Supports (THUMB SPLINT/RIGHT SMALL) MISC, 1  Units by Does not apply route daily., Disp: 1 each, Rfl: 0 .  fluticasone (FLONASE) 50 MCG/ACT nasal spray, USE 2 SPRAYS IN EACH NOSTRIL DAILY, Disp: 48 g, Rfl: 3 .  lisinopril-hydrochlorothiazide (ZESTORETIC) 20-25 MG tablet, Take 1 tablet by mouth daily., Disp: 90 tablet, Rfl: 2 .  OVER THE COUNTER MEDICATION, Probiotic with cranberry  1 sometimes, Disp: , Rfl:  .  vitamin C (ASCORBIC ACID) 500 MG tablet, Take 500 mg by mouth daily., Disp: , Rfl:    No Known Allergies    The patient states she uses post menopausal status for birth control. Last LMP was No LMP recorded. Patient is premenopausal.. Negative for Dysmenorrhea. Negative for: breast discharge, breast lump(s), breast pain and breast self exam. Associated symptoms include abnormal vaginal bleeding. Pertinent negatives include abnormal bleeding (hematology), anxiety, decreased libido, depression, difficulty falling sleep, dyspareunia, history of infertility, nocturia, sexual dysfunction, sleep disturbances, urinary incontinence, urinary urgency, vaginal discharge and vaginal itching. Diet regular.The patient states her exercise level is  moderate.  . The patient's tobacco use is:  Social History   Tobacco Use  Smoking Status Never Smoker  Smokeless Tobacco Never Used  . She has been exposed to passive smoke. The patient's alcohol use is:  Social History   Substance and Sexual Activity  Alcohol Use Never  . Additional information: Next pap scheduled for 2022.    Review of Systems  Constitutional: Negative.   HENT: Negative.  She c/o swelling underneath her neck. Not sure what triggers her sx. No overlying rash  Eyes: Negative.  Negative for blurred vision.  Respiratory: Negative.  Negative for shortness of breath.   Cardiovascular: Negative.  Negative for chest pain and palpitations.  Gastrointestinal: Negative.   Endocrine: Negative.   Genitourinary: Negative.   Musculoskeletal: Negative.   Skin: Negative.    Allergic/Immunologic: Negative.   Neurological: Negative.   Hematological: Negative.   Psychiatric/Behavioral: Negative.      Today's Vitals   12/24/19 1503  BP: 124/78  Pulse: 69  Temp: 98.7 F (37.1 C)  TempSrc: Oral  Weight: 157 lb 3.2 oz (71.3 kg)  Height: 4' 10"  (1.473 m)  PainSc: 0-No pain   Body mass index is 32.85 kg/m.   Objective:  Physical Exam Constitutional:      General: She is not in acute distress.    Appearance: Normal appearance. She is well-developed.  HENT:     Head: Normocephalic and atraumatic.     Salivary Glands: Right salivary gland is diffusely enlarged.     Comments: Submandibular gland enlargement, nontender. No overlying rash.     Right Ear: Hearing, tympanic membrane, ear canal and external ear normal. There is no impacted cerumen.     Left Ear: Hearing, tympanic membrane, ear canal and external ear normal. There is no impacted cerumen.     Nose:     Comments: Deferred, masked    Mouth/Throat:     Comments: Deferred, masked Eyes:     General: Lids are normal.     Extraocular Movements: Extraocular movements intact.     Conjunctiva/sclera: Conjunctivae normal.     Pupils: Pupils are equal, round, and reactive to light.     Funduscopic exam:    Right eye: No papilledema.        Left eye: No papilledema.  Neck:     Thyroid: No thyroid mass.     Vascular: No carotid bruit.  Cardiovascular:     Rate and Rhythm: Normal rate and regular rhythm.     Pulses: Normal pulses.     Heart sounds: Normal heart sounds. No murmur heard.   Pulmonary:     Effort: Pulmonary effort is normal.     Breath sounds: Normal breath sounds.  Chest:     Breasts: Tanner Score is 5.        Right: Normal.        Left: Normal.  Abdominal:     General: Abdomen is flat. Bowel sounds are normal. There is no distension.     Palpations: Abdomen is soft.     Tenderness: There is no abdominal tenderness.  Musculoskeletal:        General: No swelling. Normal range  of motion.     Cervical back: Full passive range of motion without pain, normal range of motion and neck supple.     Right lower leg: No edema.     Left lower leg: No edema.  Skin:    General: Skin is warm and dry.     Capillary Refill: Capillary refill takes less than 2 seconds.  Neurological:     General: No focal deficit present.     Mental Status: She is alert and oriented to person, place, and time.     Cranial Nerves: No cranial nerve deficit.     Sensory: No sensory deficit.  Psychiatric:        Mood and Affect: Mood normal.  Behavior: Behavior normal.        Thought Content: Thought content normal.        Judgment: Judgment normal.         Assessment And Plan:     1. Encounter for annual physical exam Comments: A full exam was performed. Importance of monthly self breast exams was discussed with the patient.  PATIENT IS ADVISED TO GET 30-45 MINUTES REGULAR EXERCISE NO LESS THAN FOUR TO FIVE DAYS PER WEEK - BOTH WEIGHTBEARING EXERCISES AND AEROBIC ARE RECOMMENDED.  PATIENT IS ADVISED TO FOLLOW A HEALTHY DIET WITH AT LEAST SIX FRUITS/VEGGIES PER DAY, DECREASE INTAKE OF RED MEAT, AND TO INCREASE FISH INTAKE TO TWO DAYS PER WEEK.  MEATS/FISH SHOULD NOT BE FRIED, BAKED OR BROILED IS PREFERABLE.  I SUGGEST WEARING SPF 50 SUNSCREEN ON EXPOSED PARTS AND ESPECIALLY WHEN IN THE DIRECT SUNLIGHT FOR AN EXTENDED PERIOD OF TIME.  PLEASE AVOID FAST FOOD RESTAURANTS AND INCREASE YOUR WATER INTAKE.   2. Hypertensive nephropathy Comments: Chronic, well controlled. She will continue with current meds. EKG performed, NSR w/o acute changes. She is encouraged to follow a low-sodium diet. She will f/u in six months.  - CBC - CMP14+EGFR - Lipid panel - POCT Urinalysis Dipstick (81002) - EKG 12-Lead - POCT UA - Microalbumin  3. Stage 3 chronic kidney disease, unspecified whether stage 3a or 3b CKD ([place]) Comments: Chronic, importance of adequate hydration and optimal BP control to prevent  progression of CKD was discussed with the patient.   4. Submandibular gland swelling Comments: She will let me know if her sx persist. No treatment needed at this time. Will consider sialolithiasis.  5. Other abnormal glucose Comments: Her a1c has been elevated in the past, I will recheck this today. She is encouraged to avoid sugary beverages.  - Hemoglobin A1c  6. Estrogen deficiency Comments: I will refer her for bone density evaluation. She is encouraged to continue with calcium and vitamin D supplementation and to engage in weight-bearing exercises at least 3 days weekly. I will refer her to [facility] for this and mammogram. She is overdue for breast cancer screening as well.   7. Vitamin D deficiency disease Comments: This is normally well controlled. I will check vitamin D levels today and make supplementation recommendations as needed.  - VITAMIN D 25 Hydroxy (Vit-D Deficiency, Fractures)  8. Class 1 obesity due to excess calories with serious comorbidity and body mass index (BMI) of 32.0 to 32.9 in adult Comments: She is encouraged to strive for BMI less than 30 to decrease cardiac risk. She is encouraged to continue with her regular exercise regimen throughout the winter  Wt Readings from Last 3 Encounters:  12/24/19 157 lb (71.2 kg)  12/24/19 157 lb 3.2 oz (71.3 kg)  07/31/19 153 lb 9.6 oz (69.7 kg)      Patient was given opportunity to ask questions. Patient verbalized understanding of the plan and was able to repeat key elements of the plan. All questions were answered to their satisfaction.   Maximino Greenland, MD   I, Maximino Greenland, MD, have reviewed all documentation for this visit. The documentation on 01/03/20 for the exam, diagnosis, procedures, and orders are all accurate and complete.  THE PATIENT IS ENCOURAGED TO PRACTICE SOCIAL DISTANCING DUE TO THE COVID-19 PANDEMIC.

## 2019-12-24 NOTE — Patient Instructions (Signed)
Health Maintenance, Female Adopting a healthy lifestyle and getting preventive care are important in promoting health and wellness. Ask your health care provider about:  The right schedule for you to have regular tests and exams.  Things you can do on your own to prevent diseases and keep yourself healthy. What should I know about diet, weight, and exercise? Eat a healthy diet   Eat a diet that includes plenty of vegetables, fruits, low-fat dairy products, and lean protein.  Do not eat a lot of foods that are high in solid fats, added sugars, or sodium. Maintain a healthy weight Body mass index (BMI) is used to identify weight problems. It estimates body fat based on height and weight. Your health care provider can help determine your BMI and help you achieve or maintain a healthy weight. Get regular exercise Get regular exercise. This is one of the most important things you can do for your health. Most adults should:  Exercise for at least 150 minutes each week. The exercise should increase your heart rate and make you sweat (moderate-intensity exercise).  Do strengthening exercises at least twice a week. This is in addition to the moderate-intensity exercise.  Spend less time sitting. Even light physical activity can be beneficial. Watch cholesterol and blood lipids Have your blood tested for lipids and cholesterol at 69 years of age, then have this test every 5 years. Have your cholesterol levels checked more often if:  Your lipid or cholesterol levels are high.  You are older than 69 years of age.  You are at high risk for heart disease. What should I know about cancer screening? Depending on your health history and family history, you may need to have cancer screening at various ages. This may include screening for:  Breast cancer.  Cervical cancer.  Colorectal cancer.  Skin cancer.  Lung cancer. What should I know about heart disease, diabetes, and high blood  pressure? Blood pressure and heart disease  High blood pressure causes heart disease and increases the risk of stroke. This is more likely to develop in people who have high blood pressure readings, are of African descent, or are overweight.  Have your blood pressure checked: ? Every 3-5 years if you are 18-39 years of age. ? Every year if you are 40 years old or older. Diabetes Have regular diabetes screenings. This checks your fasting blood sugar level. Have the screening done:  Once every three years after age 40 if you are at a normal weight and have a low risk for diabetes.  More often and at a younger age if you are overweight or have a high risk for diabetes. What should I know about preventing infection? Hepatitis B If you have a higher risk for hepatitis B, you should be screened for this virus. Talk with your health care provider to find out if you are at risk for hepatitis B infection. Hepatitis C Testing is recommended for:  Everyone born from 1945 through 1965.  Anyone with known risk factors for hepatitis C. Sexually transmitted infections (STIs)  Get screened for STIs, including gonorrhea and chlamydia, if: ? You are sexually active and are younger than 69 years of age. ? You are older than 69 years of age and your health care provider tells you that you are at risk for this type of infection. ? Your sexual activity has changed since you were last screened, and you are at increased risk for chlamydia or gonorrhea. Ask your health care provider if   you are at risk.  Ask your health care provider about whether you are at high risk for HIV. Your health care provider may recommend a prescription medicine to help prevent HIV infection. If you choose to take medicine to prevent HIV, you should first get tested for HIV. You should then be tested every 3 months for as long as you are taking the medicine. Pregnancy  If you are about to stop having your period (premenopausal) and  you may become pregnant, seek counseling before you get pregnant.  Take 400 to 800 micrograms (mcg) of folic acid every day if you become pregnant.  Ask for birth control (contraception) if you want to prevent pregnancy. Osteoporosis and menopause Osteoporosis is a disease in which the bones lose minerals and strength with aging. This can result in bone fractures. If you are 65 years old or older, or if you are at risk for osteoporosis and fractures, ask your health care provider if you should:  Be screened for bone loss.  Take a calcium or vitamin D supplement to lower your risk of fractures.  Be given hormone replacement therapy (HRT) to treat symptoms of menopause. Follow these instructions at home: Lifestyle  Do not use any products that contain nicotine or tobacco, such as cigarettes, e-cigarettes, and chewing tobacco. If you need help quitting, ask your health care provider.  Do not use street drugs.  Do not share needles.  Ask your health care provider for help if you need support or information about quitting drugs. Alcohol use  Do not drink alcohol if: ? Your health care provider tells you not to drink. ? You are pregnant, may be pregnant, or are planning to become pregnant.  If you drink alcohol: ? Limit how much you use to 0-1 drink a day. ? Limit intake if you are breastfeeding.  Be aware of how much alcohol is in your drink. In the [place] one drink equals one 12 oz bottle of beer (355 mL), one 5 oz glass of wine (148 mL), or one 1 oz glass of hard liquor (44 mL). General instructions  Schedule regular health, dental, and eye exams.  Stay current with your vaccines.  Tell your health care provider if: ? You often feel depressed. ? You have ever been abused or do not feel safe at home. Summary  Adopting a healthy lifestyle and getting preventive care are important in promoting health and wellness.  Follow your health care provider's instructions about healthy  diet, exercising, and getting tested or screened for diseases.  Follow your health care provider's instructions on monitoring your cholesterol and blood pressure. This information is not intended to replace advice given to you by your health care provider. Make sure you discuss any questions you have with your health care provider. Document Revised: 01/23/2018 Document Reviewed: 01/23/2018 Elsevier Patient Education  2020 [employer]  

## 2019-12-24 NOTE — Progress Notes (Signed)
This visit occurred during the SARS-CoV-2 public health emergency.  Safety protocols were in place, including screening questions prior to the visit, additional usage of staff PPE, and extensive cleaning of exam room while observing appropriate contact time as indicated for disinfecting solutions.  Subjective:   Alyssa Lee is a 69 y.o. female who presents for Medicare Annual (Subsequent) preventive examination.  Review of Systems     Cardiac Risk Factors include: advanced age (>35men, >26 women);hypertension;obesity (BMI >30kg/m2)     Objective:    Today's Vitals   12/24/19 1546  BP: 124/78  Pulse: 69  Temp: 97.8 F (36.6 C)  TempSrc: Oral  Weight: 157 lb (71.2 kg)  Height: 4\' 10"  (1.473 m)   Body mass index is 32.81 kg/m.  Advanced Directives 12/24/2019 12/18/2018 12/12/2017  Does Patient Have a Medical Advance Directive? No No No  Would patient like information on creating a medical advance directive? - - Yes (MAU/Ambulatory/Procedural Areas - Information given)    Current Medications (verified) Outpatient Encounter Medications as of 12/24/2019  Medication Sig  . amLODipine (NORVASC) 10 MG tablet Take 1 tablet (10 mg total) by mouth daily.  . Cholecalciferol (VITAMIN D3) 5000 units CAPS Take by mouth.  . Elastic Bandages & Supports (THUMB SPLINT/RIGHT SMALL) MISC 1 Units by Does not apply route daily.  . fluticasone (FLONASE) 50 MCG/ACT nasal spray USE 2 SPRAYS IN EACH NOSTRIL DAILY  . lisinopril-hydrochlorothiazide (ZESTORETIC) 20-25 MG tablet Take 1 tablet by mouth daily.  Marland Kitchen OVER THE COUNTER MEDICATION Probiotic with cranberry  1 sometimes  . vitamin C (ASCORBIC ACID) 500 MG tablet Take 500 mg by mouth daily.   No facility-administered encounter medications on file as of 12/24/2019.    Allergies (verified) Patient has no known allergies.   History: Past Medical History:  Diagnosis Date  . Hypertension    Past Surgical History:  Procedure Laterality Date    . CESAREAN SECTION    . PARTIAL HYSTERECTOMY  2009  . TONSILLECTOMY  2004   Family History  Problem Relation Age of Onset  . Hyperthyroidism Mother   . Hypertension Mother   . Diabetes Mother   . Hypertension Father   . COPD Father   . Kidney failure Father    Social History   Socioeconomic History  . Marital status: Married    Spouse name: Not on file  . Number of children: Not on file  . Years of education: Not on file  . Highest education level: Not on file  Occupational History  . Occupation: retired  Tobacco Use  . Smoking status: Never Smoker  . Smokeless tobacco: Never Used  Vaping Use  . Vaping Use: Never used  Substance and Sexual Activity  . Alcohol use: Never  . Drug use: Never  . Sexual activity: Not Currently  Other Topics Concern  . Not on file  Social History Narrative  . Not on file   Social Determinants of Health   Financial Resource Strain: Low Risk   . Difficulty of Paying Living Expenses: Not hard at all  Food Insecurity: No Food Insecurity  . Worried About Charity fundraiser in the Last Year: Never true  . Ran Out of Food in the Last Year: Never true  Transportation Needs: No Transportation Needs  . Lack of Transportation (Medical): No  . Lack of Transportation (Non-Medical): No  Physical Activity: Inactive  . Days of Exercise per Week: 0 days  . Minutes of Exercise per Session: 0 min  Stress:  No Stress Concern Present  . Feeling of Stress : Not at all  Social Connections:   . Frequency of Communication with Friends and Family: Not on file  . Frequency of Social Gatherings with Friends and Family: Not on file  . Attends Religious Services: Not on file  . Active Member of Clubs or Organizations: Not on file  . Attends Archivist Meetings: Not on file  . Marital Status: Not on file    Tobacco Counseling Counseling given: Not Answered   Clinical Intake:  Pre-visit preparation completed: Yes  Pain : No/denies pain      Nutritional Risks: None Diabetes: No  How often do you need to have someone help you when you read instructions, pamphlets, or other written materials from your doctor or pharmacy?: 1 - Never What is the last grade level you completed in school?: [place]  Diabetic? no  Interpreter Needed?: No  Information entered by :: NAllen LPN   Activities of Daily Living In your present state of health, do you have any difficulty performing the following activities: 12/24/2019 12/24/2019  Hearing? N N  Vision? N Y  Difficulty concentrating or making decisions? N N  Walking or climbing stairs? N N  Dressing or bathing? N N  Doing errands, shopping? N N  Preparing Food and eating ? N -  Using the Toilet? N -  In the past six months, have you accidently leaked urine? N -  Do you have problems with loss of bowel control? N -  Managing your Medications? N -  Managing your Finances? N -  Housekeeping or managing your Housekeeping? N -  Some recent data might be hidden    Patient Care Team: Glendale Chard, MD as PCP - General (Internal Medicine)  Indicate any recent Medical Services you may have received from other than [facility] providers in the past year (date may be approximate).     Assessment:   This is a routine wellness examination for Alyssa Lee.  Hearing/Vision screen  Hearing Screening   125Hz  250Hz  500Hz  1000Hz  2000Hz  3000Hz  4000Hz  6000Hz  8000Hz   Right ear:           Left ear:           Vision Screening Comments: No regular eye exams, [facility]  Dietary issues and exercise activities discussed: Current Exercise Habits: The patient does not participate in regular exercise at present (walks dog)  Goals    .  Exercise 150 min/wk Moderate Activity (pt-stated)      Would like to exercise regularly    .  Patient Stated      12/18/2018, would like to build muscle tone with bands and weights    .  Patient Stated      12/24/2019, wants to ,maintain      Depression  Screen PHQ 2/9 Scores 12/24/2019 12/24/2019 12/18/2018 06/20/2018 03/14/2018 12/12/2017 12/12/2017  PHQ - 2 Score 0 0 0 0 0 0 0  PHQ- 9 Score - - 0 - - 3 -    Fall Risk Fall Risk  12/24/2019 06/17/2019 12/18/2018 06/20/2018 03/14/2018  Falls in the past year? 0 0 0 0 0  Number falls in past yr: - - 0 - -  Risk for fall due to : Medication side effect - Medication side effect - -  Follow up Falls evaluation completed;Falls prevention discussed;Education provided - Falls evaluation completed;Education provided;Falls prevention discussed - -    Any stairs in or around the home? Yes  If so, are there any without handrails? No  Home free of loose throw rugs in walkways, pet beds, electrical cords, etc? Yes  Adequate lighting in your home to reduce risk of falls? Yes   ASSISTIVE DEVICES UTILIZED TO PREVENT FALLS:  Life alert? No  Use of a cane, walker or w/c? No  Grab bars in the bathroom? No  Shower chair or bench in shower? No  Elevated toilet seat or a handicapped toilet? Yes   TIMED UP AND GO:  Was the test performed? No .     Gait steady and fast without use of assistive device  Cognitive Function:     6CIT Screen 12/24/2019 12/18/2018 12/12/2017  What Year? 0 points 0 points 0 points  What month? 0 points 0 points 0 points  What time? 0 points 0 points 0 points  Count back from 20 0 points 0 points -  Months in reverse 0 points 0 points 0 points  Repeat phrase 0 points 0 points 0 points  Total Score 0 0 -    Immunizations Immunization History  Administered Date(s) Administered  . Influenza, High Dose Seasonal PF 12/12/2017, 12/18/2018  . Moderna SARS-COVID-2 Vaccination 03/30/2019, 04/27/2019    TDAP status: Due, Education has been provided regarding the importance of this vaccine. Advised may receive this vaccine at local pharmacy or Health Dept. Aware to provide a copy of the vaccination record if obtained from local pharmacy or Health Dept. Verbalized acceptance and  understanding. Flu Vaccine status: Declined, Education has been provided regarding the importance of this vaccine but patient still declined. Advised may receive this vaccine at local pharmacy or Health Dept. Aware to provide a copy of the vaccination record if obtained from local pharmacy or Health Dept. Verbalized acceptance and understanding. Pneumococcal vaccine status: Declined,  Education has been provided regarding the importance of this vaccine but patient still declined. Advised may receive this vaccine at local pharmacy or Health Dept. Aware to provide a copy of the vaccination record if obtained from local pharmacy or Health Dept. Verbalized acceptance and understanding.  Covid-19 vaccine status: Completed vaccines  Qualifies for Shingles Vaccine? Yes   Zostavax completed No   Shingrix Completed?: No.    Education has been provided regarding the importance of this vaccine. Patient has been advised to call insurance company to determine out of pocket expense if they have not yet received this vaccine. Advised may also receive vaccine at local pharmacy or Health Dept. Verbalized acceptance and understanding.  Screening Tests Health Maintenance  Topic Date Due  . MAMMOGRAM  Never done  . DEXA SCAN  Never done  . INFLUENZA VACCINE  05/13/2020 (Originally 09/14/2019)  . TETANUS/TDAP  12/23/2020 (Originally 04/01/1969)  . PNA vac Low Risk Adult (1 of 2 - PCV13) 12/23/2020 (Originally 04/02/2015)  . Fecal DNA (Cologuard)  02/11/2020  . COVID-19 Vaccine  Completed  . Hepatitis C Screening  Completed    Health Maintenance  Health Maintenance Due  Topic Date Due  . MAMMOGRAM  Never done  . DEXA SCAN  Never done    Colorectal cancer screening: Completed 02/10/2017. Repeat every 3 years Mammogram status: to be ordered by PCP Bone Density status: to be ordered by PCP  Lung Cancer Screening: (Low Dose CT Chest recommended if Age 19-80 years, 30 pack-year currently smoking OR have quit w/in  15years.) does not qualify.   Lung Cancer Screening Referral: no  Additional Screening:  Hepatitis C Screening: does qualify; Completed 12/18/2018  Vision Screening:  Recommended annual ophthalmology exams for early detection of glaucoma and other disorders of the eye. Is the patient up to date with their annual eye exam?  No  Who is the provider or what is the name of the office in which the patient attends annual eye exams? [facility] If pt is not established with a provider, would they like to be referred to a provider to establish care? No .   Dental Screening: Recommended annual dental exams for proper oral hygiene  Community Resource Referral / Chronic Care Management: CRR required this visit?  No   CCM required this visit?  No      Plan:     I have personally reviewed and noted the following in the patient's chart:   . Medical and social history . Use of alcohol, tobacco or illicit drugs  . Current medications and supplements . Functional ability and status . Nutritional status . Physical activity . Advanced directives . List of other physicians . Hospitalizations, surgeries, and ER visits in previous 12 months . Vitals . Screenings to include cognitive, depression, and falls . Referrals and appointments  In addition, I have reviewed and discussed with patient certain preventive protocols, quality metrics, and best practice recommendations. A written personalized care plan for preventive services as well as general preventive health recommendations were provided to patient.     Kellie Simmering, LPN   [id number]   Nurse Notes:

## 2019-12-24 NOTE — Patient Instructions (Signed)
Alyssa Lee , Thank you for taking time to come for your Medicare Wellness Visit. I appreciate your ongoing commitment to your health goals. Please review the following plan we discussed and let me know if I can assist you in the future.   Screening recommendations/referrals: Colonoscopy: cologuard 02/10/2017 Mammogram: to be ordered by PCP Bone Density: to be ordered by PCP Recommended yearly ophthalmology/optometry visit for glaucoma screening and checkup Recommended yearly dental visit for hygiene and checkup  Vaccinations: Influenza vaccine: decline at this time Pneumococcal vaccine: decline Tdap vaccine: decline Shingles vaccine: discussed   Covid-19: 04/27/2019, 03/30/2019  Advanced directives: Advance directive discussed with you today. Even though you declined this today please call our office should you change your mind and we can give you the proper paperwork for you to fill out.  Conditions/risks identified: none  Next appointment: Follow up in one year for your annual wellness visit 01/12/2021 at 10:30   Preventive Care 65 Years and Older, Female Preventive care refers to lifestyle choices and visits with your health care provider that can promote health and wellness. What does preventive care include?  A yearly physical exam. This is also called an annual well check.  Dental exams once or twice a year.  Routine eye exams. Ask your health care provider how often you should have your eyes checked.  Personal lifestyle choices, including:  Daily care of your teeth and gums.  Regular physical activity.  Eating a healthy diet.  Avoiding tobacco and drug use.  Limiting alcohol use.  Practicing safe sex.  Taking low-dose aspirin every day.  Taking vitamin and mineral supplements as recommended by your health care provider. What happens during an annual well check? The services and screenings done by your health care provider during your annual well check will  depend on your age, overall health, lifestyle risk factors, and family history of disease. Counseling  Your health care provider may ask you questions about your:  Alcohol use.  Tobacco use.  Drug use.  Emotional well-being.  Home and relationship well-being.  Sexual activity.  Eating habits.  History of falls.  Memory and ability to understand (cognition).  Work and work Statistician.  Reproductive health. Screening  You may have the following tests or measurements:  Height, weight, and BMI.  Blood pressure.  Lipid and cholesterol levels. These may be checked every 5 years, or more frequently if you are over 61 years old.  Skin check.  Lung cancer screening. You may have this screening every year starting at age 47 if you have a 30-pack-year history of smoking and currently smoke or have quit within the past 15 years.  Fecal occult blood test (FOBT) of the stool. You may have this test every year starting at age 53.  Flexible sigmoidoscopy or colonoscopy. You may have a sigmoidoscopy every 5 years or a colonoscopy every 10 years starting at age 39.  Hepatitis C blood test.  Hepatitis B blood test.  Sexually transmitted disease (STD) testing.  Diabetes screening. This is done by checking your blood sugar (glucose) after you have not eaten for a while (fasting). You may have this done every 1-3 years.  Bone density scan. This is done to screen for osteoporosis. You may have this done starting at age 57.  Mammogram. This may be done every 1-2 years. Talk to your health care provider about how often you should have regular mammograms. Talk with your health care provider about your test results, treatment options, and if necessary,  the need for more tests. Vaccines  Your health care provider may recommend certain vaccines, such as:  Influenza vaccine. This is recommended every year.  Tetanus, diphtheria, and acellular pertussis (Tdap, Td) vaccine. You may need a  Td booster every 10 years.  Zoster vaccine. You may need this after age 69.  Pneumococcal 13-valent conjugate (PCV13) vaccine. One dose is recommended after age 56.  Pneumococcal polysaccharide (PPSV23) vaccine. One dose is recommended after age 3. Talk to your health care provider about which screenings and vaccines you need and how often you need them. This information is not intended to replace advice given to you by your health care provider. Make sure you discuss any questions you have with your health care provider. Document Released: 02/26/2015 Document Revised: 10/20/2015 Document Reviewed: 12/01/2014 Elsevier Interactive Patient Education  2017 [employer] Prevention in the Home Falls can cause injuries. They can happen to people of all ages. There are many things you can do to make your home safe and to help prevent falls. What can I do on the outside of my home?  Regularly fix the edges of walkways and driveways and fix any cracks.  Remove anything that might make you trip as you walk through a door, such as a raised step or threshold.  Trim any bushes or trees on the path to your home.  Use bright outdoor lighting.  Clear any walking paths of anything that might make someone trip, such as rocks or tools.  Regularly check to see if handrails are loose or broken. Make sure that both sides of any steps have handrails.  Any raised decks and porches should have guardrails on the edges.  Have any leaves, snow, or ice cleared regularly.  Use sand or salt on walking paths during winter.  Clean up any spills in your garage right away. This includes oil or grease spills. What can I do in the bathroom?  Use night lights.  Install grab bars by the toilet and in the tub and shower. Do not use towel bars as grab bars.  Use non-skid mats or decals in the tub or shower.  If you need to sit down in the shower, use a plastic, non-slip stool.  Keep the floor dry. Clean  up any water that spills on the floor as soon as it happens.  Remove soap buildup in the tub or shower regularly.  Attach bath mats securely with double-sided non-slip rug tape.  Do not have throw rugs and other things on the floor that can make you trip. What can I do in the bedroom?  Use night lights.  Make sure that you have a light by your bed that is easy to reach.  Do not use any sheets or blankets that are too big for your bed. They should not hang down onto the floor.  Have a firm chair that has side arms. You can use this for support while you get dressed.  Do not have throw rugs and other things on the floor that can make you trip. What can I do in the kitchen?  Clean up any spills right away.  Avoid walking on wet floors.  Keep items that you use a lot in easy-to-reach places.  If you need to reach something above you, use a strong step stool that has a grab bar.  Keep electrical cords out of the way.  Do not use floor polish or wax that makes floors slippery. If you must use  wax, use non-skid floor wax.  Do not have throw rugs and other things on the floor that can make you trip. What can I do with my stairs?  Do not leave any items on the stairs.  Make sure that there are handrails on both sides of the stairs and use them. Fix handrails that are broken or loose. Make sure that handrails are as long as the stairways.  Check any carpeting to make sure that it is firmly attached to the stairs. Fix any carpet that is loose or worn.  Avoid having throw rugs at the top or bottom of the stairs. If you do have throw rugs, attach them to the floor with carpet tape.  Make sure that you have a light switch at the top of the stairs and the bottom of the stairs. If you do not have them, ask someone to add them for you. What else can I do to help prevent falls?  Wear shoes that:  Do not have high heels.  Have rubber bottoms.  Are comfortable and fit you well.  Are  closed at the toe. Do not wear sandals.  If you use a stepladder:  Make sure that it is fully opened. Do not climb a closed stepladder.  Make sure that both sides of the stepladder are locked into place.  Ask someone to hold it for you, if possible.  Clearly mark and make sure that you can see:  Any grab bars or handrails.  First and last steps.  Where the edge of each step is.  Use tools that help you move around (mobility aids) if they are needed. These include:  Canes.  Walkers.  Scooters.  Crutches.  Turn on the lights when you go into a dark area. Replace any light bulbs as soon as they burn out.  Set up your furniture so you have a clear path. Avoid moving your furniture around.  If any of your floors are uneven, fix them.  If there are any pets around you, be aware of where they are.  Review your medicines with your doctor. Some medicines can make you feel dizzy. This can increase your chance of falling. Ask your doctor what other things that you can do to help prevent falls. This information is not intended to replace advice given to you by your health care provider. Make sure you discuss any questions you have with your health care provider. Document Released: 11/26/2008 Document Revised: 07/08/2015 Document Reviewed: 03/06/2014 Elsevier Interactive Patient Education  2017 [employer].

## 2019-12-25 LAB — CMP14+EGFR
ALT: 12 IU/L (ref 0–32)
AST: 10 IU/L (ref 0–40)
Albumin/Globulin Ratio: 1.4 (ref 1.2–2.2)
Albumin: 4.5 g/dL (ref 3.8–4.8)
Alkaline Phosphatase: 64 IU/L (ref 44–121)
BUN/Creatinine Ratio: 12 (ref 12–28)
BUN: 12 mg/dL (ref 8–27)
Bilirubin Total: <0.2 mg/dL (ref 0.0–1.2)
CO2: 25 mmol/L (ref 20–29)
Calcium: 9.7 mg/dL (ref 8.7–10.3)
Chloride: 101 mmol/L (ref 96–106)
Creatinine, Ser: 1.02 mg/dL — ABNORMAL HIGH (ref 0.57–1.00)
GFR calc Af Amer: 65 mL/min/{1.73_m2} (ref >59)
GFR calc non Af Amer: 56 mL/min/{1.73_m2} — ABNORMAL LOW (ref >59)
Globulin, Total: 3.2 g/dL (ref 1.5–4.5)
Glucose: 99 mg/dL (ref 65–99)
Potassium: 4 mmol/L (ref 3.5–5.2)
Sodium: 139 mmol/L (ref 134–144)
Total Protein: 7.7 g/dL (ref 6.0–8.5)

## 2019-12-25 LAB — VITAMIN D 25 HYDROXY (VIT D DEFICIENCY, FRACTURES): Vit D, 25-Hydroxy: 72 ng/mL (ref 30.0–100.0)

## 2019-12-25 LAB — LIPID PANEL
Chol/HDL Ratio: 3.1 ratio (ref 0.0–4.4)
Cholesterol, Total: 193 mg/dL (ref 100–199)
HDL: 62 mg/dL (ref >39)
LDL Chol Calc (NIH): 114 mg/dL — ABNORMAL HIGH (ref 0–99)
Triglycerides: 94 mg/dL (ref 0–149)
VLDL Cholesterol Cal: 17 mg/dL (ref 5–40)

## 2019-12-25 LAB — CBC
Hematocrit: 38.2 % (ref 34.0–46.6)
Hemoglobin: 12.5 g/dL (ref 11.1–15.9)
MCH: 29 pg (ref 26.6–33.0)
MCHC: 32.7 g/dL (ref 31.5–35.7)
MCV: 89 fL (ref 79–97)
Platelets: 262 10*3/uL (ref 150–450)
RBC: 4.31 x10E6/uL (ref 3.77–5.28)
RDW: 13.2 % (ref 11.7–15.4)
WBC: 4 10*3/uL (ref 3.4–10.8)

## 2019-12-25 LAB — HEMOGLOBIN A1C
Est. average glucose Bld gHb Est-mCnc: 131 mg/dL
Hgb A1c MFr Bld: 6.2 % — ABNORMAL HIGH (ref 4.8–5.6)

## 2020-01-23 ENCOUNTER — Ambulatory Visit (INDEPENDENT_AMBULATORY_CARE_PROVIDER_SITE_OTHER): Payer: Medicare HMO

## 2020-01-23 ENCOUNTER — Other Ambulatory Visit: Payer: Self-pay

## 2020-01-23 DIAGNOSIS — Z23 Encounter for immunization: Secondary | ICD-10-CM | POA: Diagnosis not present

## 2020-01-23 NOTE — Progress Notes (Signed)
   Covid-19 Vaccination Clinic  Name:  Jazzalyn Loewenstein    MRN: [id number] DOB: [birth year]  01/23/2020  Ms. Mcneece was observed post Covid-19 immunization for 15 minutes without incident. She was provided with Vaccine Information Sheet and instruction to access the V-Safe system.   Ms. Malveaux was instructed to call 911 with any severe reactions post vaccine: Marland Kitchen Difficulty breathing  . Swelling of face and throat  . A fast heartbeat  . A bad rash all over body  . Dizziness and weakness   Immunizations Administered    No immunizations on file.

## 2020-03-09 ENCOUNTER — Other Ambulatory Visit: Payer: Self-pay | Admitting: Internal Medicine

## 2020-03-09 DIAGNOSIS — E2839 Other primary ovarian failure: Secondary | ICD-10-CM

## 2020-03-15 ENCOUNTER — Other Ambulatory Visit: Payer: Self-pay | Admitting: Internal Medicine

## 2020-03-31 ENCOUNTER — Ambulatory Visit (INDEPENDENT_AMBULATORY_CARE_PROVIDER_SITE_OTHER): Payer: Medicare HMO | Admitting: Nurse Practitioner

## 2020-03-31 ENCOUNTER — Encounter: Payer: Self-pay | Admitting: Nurse Practitioner

## 2020-03-31 ENCOUNTER — Other Ambulatory Visit: Payer: Self-pay

## 2020-03-31 VITALS — Temp 98.3°F | Ht <= 58 in | Wt 153.0 lb

## 2020-03-31 DIAGNOSIS — R42 Dizziness and giddiness: Secondary | ICD-10-CM

## 2020-03-31 DIAGNOSIS — I129 Hypertensive chronic kidney disease with stage 1 through stage 4 chronic kidney disease, or unspecified chronic kidney disease: Secondary | ICD-10-CM | POA: Diagnosis not present

## 2020-03-31 DIAGNOSIS — N183 Chronic kidney disease, stage 3 unspecified: Secondary | ICD-10-CM | POA: Diagnosis not present

## 2020-03-31 NOTE — Progress Notes (Signed)
I,Yamilka Roman [employer] as a Education administrator for [employer], FNP.,have documented all relevant documentation on the behalf of Minette Brine, FNP,as directed by  Minette Brine, FNP while in the presence of Minette Brine, [place]. This visit occurred during the SARS-CoV-2 public health emergency.  Safety protocols were in place, including screening questions prior to the visit, additional usage of staff PPE, and extensive cleaning of exam room while observing appropriate contact time as indicated for disinfecting solutions.  Subjective:     Patient ID: Alyssa Lee , female    DOB: 1950/10/24 , 70 y.o.   MRN: [id number]   Chief Complaint  Patient presents with  . Hypertension    She stated the bottom number on her blood pressure has been running low in the 60s.  . Dizziness    HPI  Patient presents today for light headedness, low blood pressure and dizziness last week. She stated her bottom number has been running in the 60s.  Her dizziness is not as bad today, she is able to drive now and she is able to turn her head better without any dizziness.  Denies rhinitis, cough, congestion or headache. She is also having fatigue. She will be 70 years old tomorrow. She has not worked since 2013, has a little job at [employer].  She is caring for a 70 y/o who she has had since he was 5 months.  When this initially started she had a heaviness in her chest. She has not been eating as much lately.      Past Medical History:  Diagnosis Date  . Hypertension      Family History  Problem Relation Age of Onset  . Hyperthyroidism Mother   . Hypertension Mother   . Diabetes Mother   . Hypertension Father   . COPD Father   . Kidney failure Father      Current Outpatient Medications:  .  amLODipine (NORVASC) 10 MG tablet, TAKE 1 TABLET DAILY, Disp: 90 tablet, Rfl: 3 .  Cholecalciferol (VITAMIN D3) 5000 units CAPS, Take by mouth., Disp: , Rfl:  .  Elastic Bandages & Supports (THUMB SPLINT/RIGHT SMALL) MISC, 1  Units by Does not apply route daily., Disp: 1 each, Rfl: 0 .  fluticasone (FLONASE) 50 MCG/ACT nasal spray, USE 2 SPRAYS IN EACH NOSTRIL DAILY, Disp: 48 g, Rfl: 3 .  lisinopril-hydrochlorothiazide (ZESTORETIC) 20-25 MG tablet, TAKE 1 TABLET DAILY, Disp: 90 tablet, Rfl: 3 .  OVER THE COUNTER MEDICATION, Probiotic with cranberry  1 sometimes, Disp: , Rfl:  .  vitamin C (ASCORBIC ACID) 500 MG tablet, Take 500 mg by mouth daily., Disp: , Rfl:    No Known Allergies   Review of Systems  Constitutional: Negative.   HENT: Negative.   Eyes: Negative.   Respiratory: Negative.   Cardiovascular: Negative.   Gastrointestinal: Negative.   Endocrine: Negative.   Genitourinary: Negative.   Musculoskeletal: Negative.   Skin: Negative.   Neurological: Positive for dizziness and light-headedness.  Hematological: Negative.   Psychiatric/Behavioral: Negative.      Today's Vitals   03/31/20 1417  Temp: 98.3 F (36.8 C)  TempSrc: Oral  Weight: 153 lb (69.4 kg)  Height: 4\' 10"  (1.473 m)  PainSc: 0-No pain   Body mass index is 31.98 kg/m.   Objective:  Physical Exam Constitutional:      General: She is not in acute distress.    Appearance: Normal appearance. She is obese.  Cardiovascular:     Rate and Rhythm: Normal rate and regular  rhythm.     Pulses: Normal pulses.     Heart sounds: Normal heart sounds. No murmur heard.   Pulmonary:     Effort: Pulmonary effort is normal. No respiratory distress.     Breath sounds: Normal breath sounds. No wheezing.  Musculoskeletal:        General: Normal range of motion.     Cervical back: Normal range of motion and neck supple.  Skin:    General: Skin is warm and dry.     Capillary Refill: Capillary refill takes less than 2 seconds.  Neurological:     General: No focal deficit present.     Mental Status: She is alert and oriented to person, place, and time.     Cranial Nerves: No cranial nerve deficit.  Psychiatric:        Mood and Affect:  Mood normal.        Behavior: Behavior normal.        Thought Content: Thought content normal.        Judgment: Judgment normal.         Assessment And Plan:     1. Hypertensive nephropathy  Chronic, her blood pressure is better here at the office and in comparison to her blood pressure cuff which was extremely elevated but does not appear to be accurate.  I have advised her to get a new blood pressure cuff that goes around the arm. If continues to be low with symptoms will consider cutting back on one of her blood pressure medications.   2. Dizziness  She reports she is feeling better, if this persists she is to call the office     Patient was given opportunity to ask questions. Patient verbalized understanding of the plan and was able to repeat key elements of the plan. All questions were answered to their satisfaction.  Minette Brine, FNP     I, Minette Brine, FNP, have reviewed all documentation for this visit. The documentation on 03/31/20 for the exam, diagnosis, procedures, and orders are all accurate and complete.   THE PATIENT IS ENCOURAGED TO PRACTICE SOCIAL DISTANCING DUE TO THE COVID-19 PANDEMIC.

## 2020-06-02 ENCOUNTER — Telehealth (INDEPENDENT_AMBULATORY_CARE_PROVIDER_SITE_OTHER): Payer: Medicare HMO | Admitting: Nurse Practitioner

## 2020-06-02 DIAGNOSIS — J320 Chronic maxillary sinusitis: Secondary | ICD-10-CM | POA: Diagnosis not present

## 2020-06-02 MED ORDER — AMOXICILLIN-POT CLAVULANATE 875-125 MG PO TABS: 1.0000 | ORAL_TABLET | Freq: Two times a day (BID) | ORAL | 0 refills | Status: AC

## 2020-06-02 NOTE — Progress Notes (Signed)
Virtual Visit via video call    This visit type was conducted due to national recommendations for restrictions regarding the COVID-19 Pandemic (e.g. social distancing) in an effort to limit this patient's exposure and mitigate transmission in our community.  Due to her co-morbid illnesses, this patient is at least at moderate risk for complications without adequate follow up.  This format is felt to be most appropriate for this patient at this time.  All issues noted in this document were discussed and addressed.  A limited physical exam was performed with this format.    This visit type was conducted due to national recommendations for restrictions regarding the COVID-19 Pandemic (e.g. social distancing) in an effort to limit this patient's exposure and mitigate transmission in our community.  Patients identity confirmed using two different identifiers.  This format is felt to be most appropriate for this patient at this time.  All issues noted in this document were discussed and addressed.  No physical exam was performed (except for noted visual exam findings with Video Visits).    Date:  06/02/2020   ID:  Alyssa Lee, DOB 1951/01/01, MRN [id number]  Patient Location:  Home   Provider location:   Office    Chief Complaint:  Sinus Congestion   History of Present Illness:    Alyssa Lee is a 70 y.o. female who presents via video conferencing for a telehealth visit today.      The symptoms started about 1 week ago. She has had sinus congestion, headache, postnasal drip, cough. No fever. She is coughing up mucus which is clear white in description. She is not wheezing. More congestion. Fatigue. Her grand baby recently had a virus with nausea and vomiting. Her frontal sinuses are tender. No chest pain. She get out of breath more now because of all of the congestion making it harder for her to breath. She has had the covid vaccine and the booster. She has had this type of sinus infection  in the past for which she was treated for.     Past Medical History:  Diagnosis Date  . Hypertension    Past Surgical History:  Procedure Laterality Date  . CESAREAN SECTION    . PARTIAL HYSTERECTOMY  2009  . TONSILLECTOMY  2004     Current Meds  Medication Sig  . amoxicillin-clavulanate (AUGMENTIN) 875-125 MG tablet Take 1 tablet by mouth 2 (two) times daily for 7 days.     Allergies:   Patient has no known allergies.   Social History   Tobacco Use  . Smoking status: Never Smoker  . Smokeless tobacco: Never Used  Vaping Use  . Vaping Use: Never used  Substance Use Topics  . Alcohol use: Never  . Drug use: Never     Family Hx: The patient's family history includes COPD in her father; Diabetes in her mother; Hypertension in her father and mother; Hyperthyroidism in her mother; Kidney failure in her father.  ROS:   Please see the history of present illness.    Review of Systems  Constitutional: Negative for chills and fever.  HENT: Positive for congestion and sinus pain. Negative for ear pain and sore throat.   Respiratory: Positive for cough and sputum production. Negative for wheezing.   Cardiovascular: Negative for chest pain.  Gastrointestinal: Negative for constipation and diarrhea.  Musculoskeletal: Negative for myalgias.  Skin: Negative for rash.  Neurological: Positive for headaches.    All other systems reviewed and are negative.   Labs/Other  Tests and Data Reviewed:    Recent Labs: 12/24/2019: ALT 12; BUN 12; Creatinine, Ser 1.02; Hemoglobin 12.5; Platelets 262; Potassium 4.0; Sodium 139   Recent Lipid Panel Lab Results  Component Value Date/Time   CHOL 193 12/24/2019 04:10 PM   TRIG 94 12/24/2019 04:10 PM   HDL 62 12/24/2019 04:10 PM   CHOLHDL 3.1 12/24/2019 04:10 PM   LDLCALC 114 (H) 12/24/2019 04:10 PM    Wt Readings from Last 3 Encounters:  03/31/20 153 lb (69.4 kg)  12/24/19 157 lb (71.2 kg)  12/24/19 157 lb 3.2 oz (71.3 kg)      Exam:    Vital Signs:  There were no vitals taken for this visit.    Physical Exam Vitals and nursing note reviewed.  HENT:     Head: Normocephalic and atraumatic.     Nose:     Right Sinus: Maxillary sinus tenderness present.     Left Sinus: Maxillary sinus tenderness present.     Comments: Patient was able to palpate her maxillary sinuses with instruction and reported it to be tender on both sides.  Pulmonary:     Effort: Pulmonary effort is normal.  Neurological:     Mental Status: She is alert and oriented to person, place, and time.  Psychiatric:        Mood and Affect: Affect normal.     ASSESSMENT & PLAN:    1) Chronic Maxillary sinusitis  -Patient on video call, reporting congestion, sinus pain, cough with clear phelm, headache. She does not have a fever. Denies SOB or chest pain. She reports on wheezing.  -Go ahead treat her with antx  -Augmentin 875/125 BID x 7 days sent to pharmacy.  -Recommended to use OTC pain relievers.  -Take vitamin C, D, Zinc -Advised to stay well hydrated with water.  -Patient will call us if her symptoms do not improve, experiences any chest pain or shortness of breath.  -Given samples of Nordel to take for symptom relief.     COVID-19 Education: The signs and symptoms of COVID-19 were discussed with the patient and how to seek care for testing (follow up with PCP or arrange E-visit).  The importance of social distancing was discussed today.  Patient Risk:   After full review of this patients clinical status, I feel that they are at least moderate risk at this time.  Time:   Today, I have spent 15 minutes/ seconds with the patient with telehealth technology discussing above diagnoses.     Medication Adjustments/Labs and Tests Ordered: Current medicines are reviewed at length with the patient today.  Concerns regarding medicines are outlined above.   Tests Ordered: No orders of the defined types were placed in this  encounter.   Medication Changes: Meds ordered this encounter  Medications  . amoxicillin-clavulanate (AUGMENTIN) 875-125 MG tablet    Sig: Take 1 tablet by mouth 2 (two) times daily for 7 days.    Dispense:  14 tablet    Refill:  0    Disposition:  Follow up if symptoms do not get better or worsen.   Signed, Bary Castilla, DNP

## 2020-06-03 ENCOUNTER — Other Ambulatory Visit: Payer: Self-pay

## 2020-06-03 DIAGNOSIS — Z1152 Encounter for screening for COVID-19: Secondary | ICD-10-CM

## 2020-06-11 ENCOUNTER — Other Ambulatory Visit: Payer: Self-pay | Admitting: Internal Medicine

## 2020-06-22 ENCOUNTER — Encounter: Payer: Self-pay | Admitting: Internal Medicine

## 2020-06-22 ENCOUNTER — Ambulatory Visit (INDEPENDENT_AMBULATORY_CARE_PROVIDER_SITE_OTHER): Payer: Medicare HMO | Admitting: Internal Medicine

## 2020-06-22 ENCOUNTER — Other Ambulatory Visit: Payer: Self-pay

## 2020-06-22 VITALS — BP 112/70 | HR 65 | Temp 98.0°F | Ht <= 58 in | Wt 154.0 lb

## 2020-06-22 DIAGNOSIS — R7309 Other abnormal glucose: Secondary | ICD-10-CM | POA: Diagnosis not present

## 2020-06-22 DIAGNOSIS — Z6832 Body mass index (BMI) 32.0-32.9, adult: Secondary | ICD-10-CM

## 2020-06-22 DIAGNOSIS — N1831 Chronic kidney disease, stage 3a: Secondary | ICD-10-CM

## 2020-06-22 DIAGNOSIS — I129 Hypertensive chronic kidney disease with stage 1 through stage 4 chronic kidney disease, or unspecified chronic kidney disease: Secondary | ICD-10-CM

## 2020-06-22 DIAGNOSIS — Z1231 Encounter for screening mammogram for malignant neoplasm of breast: Secondary | ICD-10-CM

## 2020-06-22 DIAGNOSIS — E2839 Other primary ovarian failure: Secondary | ICD-10-CM | POA: Diagnosis not present

## 2020-06-22 DIAGNOSIS — E6609 Other obesity due to excess calories: Secondary | ICD-10-CM

## 2020-06-22 DIAGNOSIS — N183 Chronic kidney disease, stage 3 unspecified: Secondary | ICD-10-CM

## 2020-06-22 NOTE — Progress Notes (Signed)
I,Yamilka [employer] as a Education administrator for Maximino Greenland, MD.,have documented all relevant documentation on the behalf of Maximino Greenland, MD,as directed by  Maximino Greenland, MD while in the presence of Maximino Greenland, MD. This visit occurred during the SARS-CoV-2 public health emergency.  Safety protocols were in place, including screening questions prior to the visit, additional usage of staff PPE, and extensive cleaning of exam room while observing appropriate contact time as indicated for disinfecting solutions.  Subjective:     Patient ID: Alyssa Lee , female    DOB: April 14, 1950 , 70 y.o.   MRN: [id number]   Chief Complaint  Patient presents with  . Hypertension    HPI  Patient presents today for a hypertension f/u.  She reports compliance with meds. She denies having any headaches chest pain, shortness of breath and palpitations.     Hypertension This is a chronic problem. The current episode started more than 1 year ago. The problem has been gradually improving since onset. The problem is controlled. Pertinent negatives include no blurred vision, chest pain, palpitations or shortness of breath. Past treatments include diuretics and ACE inhibitors. The current treatment provides moderate improvement. Hypertensive end-organ damage includes kidney disease.     Past Medical History:  Diagnosis Date  . Hypertension      Family History  Problem Relation Age of Onset  . Hyperthyroidism Mother   . Hypertension Mother   . Diabetes Mother   . Hypertension Father   . COPD Father   . Kidney failure Father      Current Outpatient Medications:  .  amLODipine (NORVASC) 10 MG tablet, TAKE 1 TABLET DAILY, Disp: 90 tablet, Rfl: 3 .  Cholecalciferol (VITAMIN D3) 5000 units CAPS, Take by mouth., Disp: , Rfl:  .  Elastic Bandages & Supports (THUMB SPLINT/RIGHT SMALL) MISC, 1 Units by Does not apply route daily., Disp: 1 each, Rfl: 0 .  fluticasone (FLONASE) 50 MCG/ACT nasal  spray, USE 2 SPRAYS IN EACH NOSTRIL DAILY, Disp: 48 g, Rfl: 3 .  lisinopril-hydrochlorothiazide (ZESTORETIC) 20-25 MG tablet, TAKE 1 TABLET DAILY, Disp: 90 tablet, Rfl: 3 .  OVER THE COUNTER MEDICATION, Probiotic with cranberry  1 sometimes, Disp: , Rfl:  .  vitamin C (ASCORBIC ACID) 500 MG tablet, Take 500 mg by mouth daily., Disp: , Rfl:    No Known Allergies   Review of Systems  Constitutional: Negative.   Eyes: Negative for blurred vision.  Respiratory: Negative.  Negative for shortness of breath.   Cardiovascular: Negative.  Negative for chest pain and palpitations.  Gastrointestinal: Negative.   Neurological: Negative.   Psychiatric/Behavioral: Negative.      Today's Vitals   06/22/20 1421  BP: 112/70  Pulse: 65  Temp: 98 F (36.7 C)  TempSrc: Oral  Weight: 154 lb (69.9 kg)  Height: 4' 10"  (1.473 m)  PainSc: 0-No pain   Body mass index is 32.19 kg/m.  Wt Readings from Last 3 Encounters:  06/22/20 154 lb (69.9 kg)  03/31/20 153 lb (69.4 kg)  12/24/19 157 lb (71.2 kg)    Objective:  Physical Exam Vitals and nursing note reviewed.  Constitutional:      Appearance: Normal appearance.  HENT:     Head: Normocephalic and atraumatic.     Nose:     Comments: Masked     Mouth/Throat:     Comments: Masked  Eyes:     Extraocular Movements: Extraocular movements intact.  Cardiovascular:     Rate and  Rhythm: Normal rate and regular rhythm.     Heart sounds: Normal heart sounds.  Pulmonary:     Effort: Pulmonary effort is normal.     Breath sounds: Normal breath sounds.  Skin:    General: Skin is warm.  Neurological:     General: No focal deficit present.     Mental Status: She is alert.  Psychiatric:        Mood and Affect: Mood normal.        Behavior: Behavior normal.         Assessment And Plan:     1. Hypertensive nephropathy Comments: Chronic, well controlled. She is encouraged to follow low sodium diet. I will not make any medication changes. She  will f/u in 6 months.  - CMP14+EGFR  2. Stage 3a chronic kidney disease ([place]) Comments: Chronic, I will check renal function today. Encouraged to stay well hydrated and to keep BP well controlled.   3. Other abnormal glucose Comments: Her a1c has been elevated in the past. I will recheck this today. She is encouraged to limit her intake of sweetened beverages, including diet drinks.  - Hemoglobin A1c  4. Estrogen deficiency Comments: She agrees to referral for bone density exam. Encouraged to perform weight-bearing exercises at least 3 days/week, take calcium/vit D daily.  - DG Bone Density; Future  5. Encounter for screening mammogram for malignant neoplasm of breast Comments: I will refer her for mammogram. Advised to perform monthly SBE.  - MM Digital Screening; Future  6. Class 1 obesity due to excess calories with serious comorbidity and body mass index (BMI) of 32.0 to 32.9 in adult Comments: She is encouraged to strive for BMI less than 30 to decrease cardiac risk. Advised to aim for at least 150 minutes of exercise per week.   Patient was given opportunity to ask questions. Patient verbalized understanding of the plan and was able to repeat key elements of the plan. All questions were answered to their satisfaction.   I, Maximino Greenland, MD, have reviewed all documentation for this visit. The documentation on 06/22/20 for the exam, diagnosis, procedures, and orders are all accurate and complete.   IF YOU HAVE BEEN REFERRED TO A SPECIALIST, IT MAY TAKE 1-2 WEEKS TO SCHEDULE/PROCESS THE REFERRAL. IF YOU HAVE NOT HEARD FROM US/SPECIALIST IN TWO WEEKS, PLEASE GIVE [place] A CALL AT [phone].   THE PATIENT IS ENCOURAGED TO PRACTICE SOCIAL DISTANCING DUE TO THE COVID-19 PANDEMIC.

## 2020-06-22 NOTE — Patient Instructions (Signed)

## 2020-06-23 ENCOUNTER — Other Ambulatory Visit: Payer: Self-pay

## 2020-06-23 DIAGNOSIS — N289 Disorder of kidney and ureter, unspecified: Secondary | ICD-10-CM

## 2020-06-23 LAB — CMP14+EGFR
ALT: 15 IU/L (ref 0–32)
AST: 14 IU/L (ref 0–40)
Albumin/Globulin Ratio: 1.6 (ref 1.2–2.2)
Albumin: 4.7 g/dL (ref 3.8–4.8)
Alkaline Phosphatase: 50 IU/L (ref 44–121)
BUN/Creatinine Ratio: 10 — ABNORMAL LOW (ref 12–28)
BUN: 11 mg/dL (ref 8–27)
Bilirubin Total: 0.3 mg/dL (ref 0.0–1.2)
CO2: 25 mmol/L (ref 20–29)
Calcium: 10.1 mg/dL (ref 8.7–10.3)
Chloride: 99 mmol/L (ref 96–106)
Creatinine, Ser: 1.14 mg/dL — ABNORMAL HIGH (ref 0.57–1.00)
Globulin, Total: 3 g/dL (ref 1.5–4.5)
Glucose: 90 mg/dL (ref 65–99)
Potassium: 4.8 mmol/L (ref 3.5–5.2)
Sodium: 138 mmol/L (ref 134–144)
Total Protein: 7.7 g/dL (ref 6.0–8.5)
eGFR: 52 mL/min/{1.73_m2} — ABNORMAL LOW (ref >59)

## 2020-06-23 LAB — HEMOGLOBIN A1C
Est. average glucose Bld gHb Est-mCnc: 131 mg/dL
Hgb A1c MFr Bld: 6.2 % — ABNORMAL HIGH (ref 4.8–5.6)

## 2020-06-28 LAB — PROTEIN ELECTROPHORESIS
A/G Ratio: 1.1 (ref 0.7–1.7)
Albumin ELP: 4 g/dL (ref 2.9–4.4)
Alpha 1: 0.2 g/dL (ref 0.0–0.4)
Alpha 2: 0.7 g/dL (ref 0.4–1.0)
Beta: 1.2 g/dL (ref 0.7–1.3)
Gamma Globulin: 1.6 g/dL (ref 0.4–1.8)
Globulin, Total: 3.7 g/dL (ref 2.2–3.9)
Total Protein: 7.7 g/dL (ref 6.0–8.5)

## 2020-06-28 LAB — SPECIMEN STATUS REPORT

## 2020-07-19 ENCOUNTER — Ambulatory Visit
Admission: RE | Admit: 2020-07-19 | Discharge: 2020-07-19 | Disposition: A | Discharge status: Home / Self Care | Payer: Medicare HMO | Source: Ambulatory Visit | Attending: Internal Medicine | Admitting: Internal Medicine

## 2020-07-19 DIAGNOSIS — N289 Disorder of kidney and ureter, unspecified: Secondary | ICD-10-CM

## 2020-09-07 ENCOUNTER — Other Ambulatory Visit: Payer: Self-pay

## 2020-09-07 ENCOUNTER — Ambulatory Visit (INDEPENDENT_AMBULATORY_CARE_PROVIDER_SITE_OTHER): Payer: Medicare HMO

## 2020-09-07 DIAGNOSIS — Z23 Encounter for immunization: Secondary | ICD-10-CM

## 2020-09-13 ENCOUNTER — Ambulatory Visit (INDEPENDENT_AMBULATORY_CARE_PROVIDER_SITE_OTHER): Payer: Medicare HMO | Admitting: Internal Medicine

## 2020-09-13 ENCOUNTER — Encounter: Payer: Self-pay | Admitting: Internal Medicine

## 2020-09-13 ENCOUNTER — Other Ambulatory Visit: Payer: Self-pay

## 2020-09-13 VITALS — BP 120/64 | HR 64 | Temp 98.7°F | Ht 58.88 in | Wt 153.8 lb

## 2020-09-13 DIAGNOSIS — N182 Chronic kidney disease, stage 2 (mild): Secondary | ICD-10-CM | POA: Diagnosis not present

## 2020-09-13 DIAGNOSIS — R7309 Other abnormal glucose: Secondary | ICD-10-CM

## 2020-09-13 DIAGNOSIS — E6609 Other obesity due to excess calories: Secondary | ICD-10-CM | POA: Diagnosis not present

## 2020-09-13 DIAGNOSIS — Z6831 Body mass index (BMI) 31.0-31.9, adult: Secondary | ICD-10-CM

## 2020-09-13 DIAGNOSIS — I129 Hypertensive chronic kidney disease with stage 1 through stage 4 chronic kidney disease, or unspecified chronic kidney disease: Secondary | ICD-10-CM

## 2020-09-13 NOTE — Patient Instructions (Addendum)
Chronic Kidney Disease, Adult Chronic kidney disease is when lasting damage happens to the kidneys slowly over a long time. The kidneys help to: Make pee (urine). Make hormones. Keep the right amount of fluids and chemicals in the body. Most often, this disease does not go away. You must take steps to help keep the kidney damage from getting worse. If steps are not taken, the kidneys might stop working forever. What are the causes? Diabetes. High blood pressure. Diseases that affect the heart and blood vessels. Other kidney diseases. Diseases of the body's disease-fighting system. A problem with the flow of pee. Infections of the organs that make pee, store it, and take it out of the body. Swelling or irritation of your blood vessels. What increases the risk? Getting older. Having someone in your family who has kidney disease or kidney failure. Having a disease caused by genes. Taking medicines often that harm the kidneys. Being near or having contact with harmful substances. Being very overweight. Using tobacco now or in the past. What are the signs or symptoms? Feeling very tired. Having a swollen face, legs, ankles, or feet. Feeling like you may vomit or vomiting. Not feeling hungry. Being confused or not able to focus. Twitches and cramps in the leg muscles or other muscles. Dry, itchy skin. A taste of metal in your mouth. Making less pee, or making more pee. Shortness of breath. Trouble sleeping. You may also become anemic or get weak bones. Anemic means there is not enough red blood cells or hemoglobin in your blood. You may get symptoms slowly. You may not notice them until the kidney damage gets very bad. How is this treated? Often, there is no cure for this disease. Treatment can help with symptoms and help keep the disease from getting worse. You may need to: Avoid alcohol. Avoid foods that are high in salt, potassium, phosphorous, and protein. Take medicines for  symptoms and to help control other conditions. Have dialysis. This treatment gets harmful waste out of your body. Treat other problems that cause your kidney disease or make it worse. Follow these instructions at home: Medicines Take over-the-counter and prescription medicines only as told by your doctor. Do not take any new medicines, vitamins, or supplements unless your doctor says it is okay. Lifestyle  Do not smoke or use any products that contain nicotine or tobacco. If you need help quitting, ask your doctor. If you drink alcohol: Limit how much you use to: 0-1 drink a day for women who are not pregnant. 0-2 drinks a day for men. Know how much alcohol is in your drink. In the [place] one drink equals one 12 oz bottle of beer (355 mL), one 5 oz glass of wine (148 mL), or one 1 oz glass of hard liquor (44 mL). Stay at a healthy weight. If you need help losing weight, ask your doctor. General instructions  Follow instructions from your doctor about what you cannot eat or drink. Track your blood pressure at home. Tell your doctor about any changes. If you have diabetes, track your blood sugar. Exercise at least 30 minutes a day, 5 days a week. Keep your shots (vaccinations) up to date. Keep all follow-up visits. Where to find more information American Association of Kidney Patients: www.aakp.org National Kidney Foundation: www.kidney.org American Kidney Fund: www.akfinc.org Life Options: www.lifeoptions.org Kidney School: www.kidneyschool.org Contact a doctor if: Your symptoms get worse. You get new symptoms. Get help right away if: You get symptoms of end-stage kidney disease. These   include: Headaches. Losing feeling in your hands or feet. Easy bruising. Having hiccups often. Chest pain. Shortness of breath. Lack of menstrual periods, in women. You have a fever. You make less pee than normal. You have pain or you bleed when you pee or poop. These symptoms may be an  emergency. Get help right away. Call your local emergency services (911 in the [place] Do not wait to see if the symptoms will go away. Do not drive yourself to the hospital. Summary Chronic kidney disease is when lasting damage happens to the kidneys slowly over a long time. Causes of this disease include diabetes and high blood pressure. Often, there is no cure for this disease. Treatment can help symptoms and help keep the disease from getting worse. Treatment may involve lifestyle changes, medicines, and dialysis. This information is not intended to replace advice given to you by your health care provider. Make sure you discuss any questions you have with your health care provider. Document Revised: 05/07/2019 Document Reviewed: 05/07/2019 Elsevier Patient Education  2022 Elsevier Inc.  

## 2020-09-13 NOTE — Progress Notes (Signed)
I,Tianna Badgett,acting as a Education administrator for Maximino Greenland, MD.,have documented all relevant documentation on the behalf of Maximino Greenland, MD,as directed by  Maximino Greenland, MD while in the presence of Maximino Greenland, MD.  This visit occurred during the SARS-CoV-2 public health emergency.  Safety protocols were in place, including screening questions prior to the visit, additional usage of staff PPE, and extensive cleaning of exam room while observing appropriate contact time as indicated for disinfecting solutions.  Subjective:     Patient ID: Alyssa Lee , female    DOB: 1950/10/26 , 70 y.o.   MRN: [id number]   Chief Complaint  Patient presents with   Hypertension    HPI  Patient presents today for a hypertension f/u.  She reports compliance with meds. She denies having any headaches chest pain, shortness of breath and palpitations. She has no specific concerns or complaints at this time.     Hypertension This is a chronic problem. The current episode started more than 1 year ago. The problem has been gradually improving since onset. The problem is controlled. Pertinent negatives include no blurred vision, chest pain, palpitations or shortness of breath. Past treatments include diuretics and ACE inhibitors. The current treatment provides moderate improvement. Hypertensive end-organ damage includes kidney disease.    Past Medical History:  Diagnosis Date   Hypertension      Family History  Problem Relation Age of Onset   Hyperthyroidism Mother    Hypertension Mother    Diabetes Mother    Hypertension Father    COPD Father    Kidney failure Father      Current Outpatient Medications:    amLODipine (NORVASC) 10 MG tablet, TAKE 1 TABLET DAILY, Disp: 90 tablet, Rfl: 3   Cholecalciferol (VITAMIN D3) 5000 units CAPS, Take by mouth., Disp: , Rfl:    Elastic Bandages & Supports (THUMB SPLINT/RIGHT SMALL) MISC, 1 Units by Does not apply route daily., Disp: 1 each, Rfl: 0    fluticasone (FLONASE) 50 MCG/ACT nasal spray, USE 2 SPRAYS IN EACH NOSTRIL DAILY, Disp: 48 g, Rfl: 3   lisinopril-hydrochlorothiazide (ZESTORETIC) 20-25 MG tablet, TAKE 1 TABLET DAILY, Disp: 90 tablet, Rfl: 3   OVER THE COUNTER MEDICATION, Probiotic with cranberry  1 sometimes, Disp: , Rfl:    vitamin C (ASCORBIC ACID) 500 MG tablet, Take 500 mg by mouth daily., Disp: , Rfl:    No Known Allergies   Review of Systems  Constitutional: Negative.   Eyes:  Negative for blurred vision.  Respiratory: Negative.  Negative for shortness of breath.   Cardiovascular: Negative.  Negative for chest pain and palpitations.  Gastrointestinal: Negative.   Neurological: Negative.     Today's Vitals   09/13/20 1410  BP: 120/64  Pulse: 64  Temp: 98.7 F (37.1 C)  TempSrc: Oral  Weight: 153 lb 12.8 oz (69.8 kg)  Height: 4' 10.88" (1.496 m)   Body mass index is 31.19 kg/m.  Wt Readings from Last 3 Encounters:  09/13/20 153 lb 12.8 oz (69.8 kg)  06/22/20 154 lb (69.9 kg)  03/31/20 153 lb (69.4 kg)    Objective:  Physical Exam Vitals and nursing note reviewed.  Constitutional:      Appearance: Normal appearance.  HENT:     Head: Normocephalic and atraumatic.     Nose:     Comments: Masked     Mouth/Throat:     Comments: Masked  Eyes:     Extraocular Movements: Extraocular movements intact.  Cardiovascular:  Rate and Rhythm: Normal rate and regular rhythm.     Heart sounds: Normal heart sounds.  Pulmonary:     Effort: Pulmonary effort is normal.     Breath sounds: Normal breath sounds.  Musculoskeletal:     Cervical back: Normal range of motion.  Skin:    General: Skin is warm.  Neurological:     General: No focal deficit present.     Mental Status: She is alert.  Psychiatric:        Mood and Affect: Mood normal.        Behavior: Behavior normal.        Assessment And Plan:     1. Hypertensive nephropathy Comments: Chronic, well controlled. I will check renal function  today.  Encouraged to follow sodium diet. I She will rto in six months for re-evaluation.  - BMP8+eGFR  2. 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 Advised to stay well hydrated.   3. Other abnormal glucose Comments: Her a1c has been elevated in the past. I will check an a1c today, encouraged to limit intake of sweetened beverages, including diet drinks.  - Hemoglobin A1c  4. Class 1 obesity due to excess calories with serious comorbidity and body mass index (BMI) of 31.0 to 31.9 in adult She is encouraged to strive for BMI less than 29 to decrease cardiac risk. Advised to aim for at least 150 minutes of exercise per week.  Patient was given opportunity to ask questions. Patient verbalized understanding of the plan and was able to repeat key elements of the plan. All questions were answered to their satisfaction.   I, Maximino Greenland, MD, have reviewed all documentation for this visit. The documentation on 09/13/20 for the exam, diagnosis, procedures, and orders are all accurate and complete.   IF YOU HAVE BEEN REFERRED TO A SPECIALIST, IT MAY TAKE 1-2 WEEKS TO SCHEDULE/PROCESS THE REFERRAL. IF YOU HAVE NOT HEARD FROM US/SPECIALIST IN TWO WEEKS, PLEASE GIVE [place] A CALL AT [phone].   THE PATIENT IS ENCOURAGED TO PRACTICE SOCIAL DISTANCING DUE TO THE COVID-19 PANDEMIC.

## 2020-09-14 LAB — BMP8+EGFR
BUN/Creatinine Ratio: 16 (ref 12–28)
BUN: 16 mg/dL (ref 8–27)
CO2: 25 mmol/L (ref 20–29)
Calcium: 10 mg/dL (ref 8.7–10.3)
Chloride: 101 mmol/L (ref 96–106)
Creatinine, Ser: 1.02 mg/dL — ABNORMAL HIGH (ref 0.57–1.00)
Glucose: 107 mg/dL — ABNORMAL HIGH (ref 65–99)
Potassium: 4.9 mmol/L (ref 3.5–5.2)
Sodium: 141 mmol/L (ref 134–144)
eGFR: 59 mL/min/{1.73_m2} — ABNORMAL LOW (ref >59)

## 2020-09-14 LAB — HEMOGLOBIN A1C
Est. average glucose Bld gHb Est-mCnc: 131 mg/dL
Hgb A1c MFr Bld: 6.2 % — ABNORMAL HIGH (ref 4.8–5.6)

## 2020-09-22 ENCOUNTER — Ambulatory Visit: Payer: Medicare HMO | Admitting: Internal Medicine

## 2021-01-12 ENCOUNTER — Ambulatory Visit: Payer: Medicare HMO

## 2021-01-12 ENCOUNTER — Encounter: Payer: Medicare HMO | Admitting: Internal Medicine

## 2021-01-20 ENCOUNTER — Ambulatory Visit: Payer: Medicare HMO

## 2021-01-25 ENCOUNTER — Ambulatory Visit: Payer: Medicare HMO

## 2021-01-27 ENCOUNTER — Ambulatory Visit (INDEPENDENT_AMBULATORY_CARE_PROVIDER_SITE_OTHER): Payer: Medicare HMO

## 2021-01-27 VITALS — Ht 60.0 in | Wt 160.0 lb

## 2021-01-27 DIAGNOSIS — Z23 Encounter for immunization: Secondary | ICD-10-CM | POA: Diagnosis not present

## 2021-01-27 DIAGNOSIS — Z0000 Encounter for general adult medical examination without abnormal findings: Secondary | ICD-10-CM | POA: Diagnosis not present

## 2021-01-27 MED ORDER — BOOSTRIX 5-2.5-18.5 LF-MCG/0.5 IM SUSP: 0.5000 mL | Freq: Once | INTRAMUSCULAR | 0 refills | Status: AC

## 2021-01-27 NOTE — Progress Notes (Signed)
I connected with Amelianna Meller today by telephone and verified that I am speaking with the correct person using two identifiers. Location patient: home Location provider: work Persons participating in the virtual visit: Deyana Wnuk, Glenna Durand LPN.   I discussed the limitations, risks, security and privacy concerns of performing an evaluation and management service by telephone and the availability of in person appointments. I also discussed with the patient that there may be a patient responsible charge related to this service. The patient expressed understanding and verbally consented to this telephonic visit.    Interactive audio and video telecommunications were attempted between this provider and patient, however failed, due to patient having technical difficulties OR patient did not have access to video capability.  We continued and completed visit with audio only.     Vital signs may be patient reported or missing.  Subjective:   Terrilynn Postell is a 70 y.o. female who presents for Medicare Annual (Subsequent) preventive examination.  Review of Systems     Cardiac Risk Factors include: advanced age (>73men, >12 women);hypertension;obesity (BMI >30kg/m2)     Objective:    Today's Vitals   01/27/21 1525  Weight: 160 lb (72.6 kg)  Height: 5' (1.524 m)  PainSc: 3    Body mass index is 31.25 kg/m.  Advanced Directives 01/27/2021 12/24/2019 12/18/2018 12/12/2017  Does Patient Have a Medical Advance Directive? No No No No  Would patient like information on creating a medical advance directive? - - - Yes (MAU/Ambulatory/Procedural Areas - Information given)    Current Medications (verified) Outpatient Encounter Medications as of 01/27/2021  Medication Sig   amLODipine (NORVASC) 10 MG tablet TAKE 1 TABLET DAILY   Cholecalciferol (VITAMIN D3) 5000 units CAPS Take by mouth.   fluticasone (FLONASE) 50 MCG/ACT nasal spray USE 2 SPRAYS IN EACH NOSTRIL DAILY    lisinopril-hydrochlorothiazide (ZESTORETIC) 20-25 MG tablet TAKE 1 TABLET DAILY   Tdap (BOOSTRIX) 5-2.5-18.5 LF-MCG/0.5 injection Inject 0.5 mLs into the muscle once for 1 dose.   vitamin C (ASCORBIC ACID) 500 MG tablet Take 500 mg by mouth daily.   Elastic Bandages & Supports (THUMB SPLINT/RIGHT SMALL) MISC 1 Units by Does not apply route daily.   OVER THE COUNTER MEDICATION Probiotic with cranberry  1 sometimes (Patient not taking: Reported on 01/27/2021)   No facility-administered encounter medications on file as of 01/27/2021.    Allergies (verified) Patient has no known allergies.   History: Past Medical History:  Diagnosis Date   Hypertension    Past Surgical History:  Procedure Laterality Date   CESAREAN SECTION     PARTIAL HYSTERECTOMY  2009   TONSILLECTOMY  2004   Family History  Problem Relation Age of Onset   Hyperthyroidism Mother    Hypertension Mother    Diabetes Mother    Hypertension Father    COPD Father    Kidney failure Father    Social History   Socioeconomic History   Marital status: Married    Spouse name: Not on file   Number of children: Not on file   Years of education: Not on file   Highest education level: Not on file  Occupational History   Occupation: retired  Tobacco Use   Smoking status: Never   Smokeless tobacco: Never  Vaping Use   Vaping Use: Never used  Substance and Sexual Activity   Alcohol use: Never   Drug use: Never   Sexual activity: Not Currently  Other Topics Concern   Not on file  Social  History Narrative   Not on file   Social Determinants of Health   Financial Resource Strain: Low Risk    Difficulty of Paying Living Expenses: Not hard at all  Food Insecurity: No Food Insecurity   Worried About Charity fundraiser in the Last Year: Never true   [place] in the Last Year: Never true  Transportation Needs: No Transportation Needs   Lack of Transportation (Medical): No   Lack of Transportation  (Non-Medical): No  Physical Activity: Insufficiently Active   Days of Exercise per Week: 7 days   Minutes of Exercise per Session: 20 min  Stress: No Stress Concern Present   Feeling of Stress : Not at all  Social Connections: Not on file    Tobacco Counseling Counseling given: Not Answered   Clinical Intake:  Pre-visit preparation completed: Yes  Pain : 0-10 Pain Score: 3  Pain Type: Acute pain Pain Location: Head Pain Descriptors / Indicators: Aching Pain Onset: Today Pain Frequency: Intermittent     Nutritional Status: BMI > 30  Obese Nutritional Risks: None  How often do you need to have someone help you when you read instructions, pamphlets, or other written materials from your doctor or pharmacy?: 1 - Never What is the last grade level you completed in school?: 26yrs college  Diabetic? no  Interpreter Needed?: No  Information entered by :: NAllen LPN   Activities of Daily Living In your present state of health, do you have any difficulty performing the following activities: 01/27/2021  Hearing? N  Vision? N  Difficulty concentrating or making decisions? N  Walking or climbing stairs? N  Dressing or bathing? N  Doing errands, shopping? N  Preparing Food and eating ? N  Using the Toilet? N  In the past six months, have you accidently leaked urine? N  Do you have problems with loss of bowel control? N  Managing your Medications? N  Managing your Finances? N  Housekeeping or managing your Housekeeping? N  Some recent data might be hidden    Patient Care Team: Glendale Chard, MD as PCP - General (Internal Medicine)  Indicate any recent Medical Services you may have received from other than [facility] providers in the past year (date may be approximate).     Assessment:   This is a routine wellness examination for Stefanee.  Hearing/Vision screen Vision Screening - Comments:: Regular eye exams, [facility]  Dietary issues and exercise activities  discussed: Current Exercise Habits: Home exercise routine, Type of exercise: walking, Time (Minutes): 20, Frequency (Times/Week): 7, Weekly Exercise (Minutes/Week): 140   Goals Addressed             This Visit's Progress    Patient Stated       01/27/2021, wants to join the gym       Depression Screen PHQ 2/9 Scores 01/27/2021 12/24/2019 12/24/2019 12/18/2018 06/20/2018 03/14/2018 12/12/2017  PHQ - 2 Score 0 0 0 0 0 0 0  PHQ- 9 Score - - - 0 - - 3    Fall Risk Fall Risk  01/27/2021 12/24/2019 06/17/2019 12/18/2018 06/20/2018  Falls in the past year? 0 0 0 0 0  Number falls in past yr: - - - 0 -  Risk for fall due to : Medication side effect Medication side effect - Medication side effect -  Follow up Falls evaluation completed;Education provided;Falls prevention discussed Falls evaluation completed;Falls prevention discussed;Education provided - Falls evaluation completed;Education provided;Falls prevention discussed -  FALL RISK PREVENTION PERTAINING TO THE HOME:  Any stairs in or around the home? Yes  If so, are there any without handrails? No  Home free of loose throw rugs in walkways, pet beds, electrical cords, etc? Yes  Adequate lighting in your home to reduce risk of falls? Yes   ASSISTIVE DEVICES UTILIZED TO PREVENT FALLS:  Life alert? No  Use of a cane, walker or w/c? No  Grab bars in the bathroom? No  Shower chair or bench in shower? Yes  Elevated toilet seat or a handicapped toilet? Yes   TIMED UP AND GO:  Was the test performed? No .      Cognitive Function:     6CIT Screen 01/27/2021 12/24/2019 12/18/2018 12/12/2017  What Year? 0 points 0 points 0 points 0 points  What month? 0 points 0 points 0 points 0 points  What time? 0 points 0 points 0 points 0 points  Count back from 20 0 points 0 points 0 points -  Months in reverse 0 points 0 points 0 points 0 points  Repeat phrase 0 points 0 points 0 points 0 points  Total Score 0 0 0 -     Immunizations Immunization History  Administered Date(s) Administered   Influenza, High Dose Seasonal PF 12/12/2017, 12/18/2018   Moderna SARS-COV2 Booster Vaccination 01/23/2020, 09/07/2020   Moderna Sars-Covid-2 Vaccination 03/30/2019, 04/27/2019    TDAP status: Due, Education has been provided regarding the importance of this vaccine. Advised may receive this vaccine at local pharmacy or Health Dept. Aware to provide a copy of the vaccination record if obtained from local pharmacy or Health Dept. Verbalized acceptance and understanding.  Flu Vaccine status: Due, Education has been provided regarding the importance of this vaccine. Advised may receive this vaccine at local pharmacy or Health Dept. Aware to provide a copy of the vaccination record if obtained from local pharmacy or Health Dept. Verbalized acceptance and understanding.  Pneumococcal vaccine status: Due, Education has been provided regarding the importance of this vaccine. Advised may receive this vaccine at local pharmacy or Health Dept. Aware to provide a copy of the vaccination record if obtained from local pharmacy or Health Dept. Verbalized acceptance and understanding.  Covid-19 vaccine status: Completed vaccines  Qualifies for Shingles Vaccine? Yes   Zostavax completed No   Shingrix Completed?: No.    Education has been provided regarding the importance of this vaccine. Patient has been advised to call insurance company to determine out of pocket expense if they have not yet received this vaccine. Advised may also receive vaccine at local pharmacy or Health Dept. Verbalized acceptance and understanding.  Screening Tests Health Maintenance  Topic Date Due   TETANUS/TDAP  Never done   MAMMOGRAM  Never done   Zoster Vaccines- Shingrix (1 of 2) Never done   DEXA SCAN  Never done   Fecal DNA (Cologuard)  02/11/2020   INFLUENZA VACCINE  09/13/2020   COVID-19 Vaccine (3 - Booster for Moderna series) 11/02/2020    Pneumonia Vaccine 75+ Years old (1 - PCV) 01/27/2022 (Originally 04/01/1956)   Hepatitis C Screening  Completed   HPV VACCINES  Aged Out   COLONOSCOPY (Pts 45-101yrs Insurance coverage will need to be confirmed)  Discontinued    Health Maintenance  Health Maintenance Due  Topic Date Due   TETANUS/TDAP  Never done   MAMMOGRAM  Never done   Zoster Vaccines- Shingrix (1 of 2) Never done   DEXA SCAN  Never done   Fecal  DNA (Cologuard)  02/11/2020   INFLUENZA VACCINE  09/13/2020   COVID-19 Vaccine (3 - Booster for Moderna series) 11/02/2020    Colorectal cancer screening: Type of screening: Colonoscopy. Completed 04/26/2017. Repeat every 10 years  Mammogram status: patient to schedule  Bone Density status: patient to schedule  Lung Cancer Screening: (Low Dose CT Chest recommended if Age 67-80 years, 30 pack-year currently smoking OR have quit w/in 15years.) does not qualify.   Lung Cancer Screening Referral: no  Additional Screening:  Hepatitis C Screening: does qualify; Completed 12/18/2018  Vision Screening: Recommended annual ophthalmology exams for early detection of glaucoma and other disorders of the eye. Is the patient up to date with their annual eye exam?  Yes  Who is the provider or what is the name of the office in which the patient attends annual eye exams? [facility] If pt is not established with a provider, would they like to be referred to a provider to establish care? No .   Dental Screening: Recommended annual dental exams for proper oral hygiene  Community Resource Referral / Chronic Care Management: CRR required this visit?  No   CCM required this visit?  No      Plan:     I have personally reviewed and noted the following in the patients chart:   Medical and social history Use of alcohol, tobacco or illicit drugs  Current medications and supplements including opioid prescriptions.  Functional ability and status Nutritional status Physical  activity Advanced directives List of other physicians Hospitalizations, surgeries, and ER visits in previous 12 months Vitals Screenings to include cognitive, depression, and falls Referrals and appointments  In addition, I have reviewed and discussed with patient certain preventive protocols, quality metrics, and best practice recommendations. A written personalized care plan for preventive services as well as general preventive health recommendations were provided to patient.     Kellie Simmering, LPN   [id number]   Nurse Notes: none

## 2021-01-27 NOTE — Patient Instructions (Signed)
Alyssa Lee , Thank you for taking time to come for your Medicare Wellness Visit. I appreciate your ongoing commitment to your health goals. Please review the following plan we discussed and let me know if I can assist you in the future.   Screening recommendations/referrals: Colonoscopy: completed 04/26/2017 Mammogram: patient to schedule Bone Density: patient to schedule Recommended yearly ophthalmology/optometry visit for glaucoma screening and checkup Recommended yearly dental visit for hygiene and checkup  Vaccinations: Influenza vaccine: due Pneumococcal vaccine: decline at this time Tdap vaccine: sent to pharmacy Shingles vaccine: discussed   Covid-19:09/07/2020, 01/23/2020, 04/27/2019, 03/30/2019  Advanced directives: Advance directive discussed with you today.   Conditions/risks identified: none  Next appointment: Follow up in one year for your annual wellness visit    Preventive Care 65 Years and Older, Female Preventive care refers to lifestyle choices and visits with your health care provider that can promote health and wellness. What does preventive care include? A yearly physical exam. This is also called an annual well check. Dental exams once or twice a year. Routine eye exams. Ask your health care provider how often you should have your eyes checked. Personal lifestyle choices, including: Daily care of your teeth and gums. Regular physical activity. Eating a healthy diet. Avoiding tobacco and drug use. Limiting alcohol use. Practicing safe sex. Taking low-dose aspirin every day. Taking vitamin and mineral supplements as recommended by your health care provider. What happens during an annual well check? The services and screenings done by your health care provider during your annual well check will depend on your age, overall health, lifestyle risk factors, and family history of disease. Counseling  Your health care provider may ask you questions about  your: Alcohol use. Tobacco use. Drug use. Emotional well-being. Home and relationship well-being. Sexual activity. Eating habits. History of falls. Memory and ability to understand (cognition). Work and work Statistician. Reproductive health. Screening  You may have the following tests or measurements: Height, weight, and BMI. Blood pressure. Lipid and cholesterol levels. These may be checked every 5 years, or more frequently if you are over 70 years old. Skin check. Lung cancer screening. You may have this screening every year starting at age 32 if you have a 30-pack-year history of smoking and currently smoke or have quit within the past 15 years. Fecal occult blood test (FOBT) of the stool. You may have this test every year starting at age 58. Flexible sigmoidoscopy or colonoscopy. You may have a sigmoidoscopy every 5 years or a colonoscopy every 10 years starting at age 64. Hepatitis C blood test. Hepatitis B blood test. Sexually transmitted disease (STD) testing. Diabetes screening. This is done by checking your blood sugar (glucose) after you have not eaten for a while (fasting). You may have this done every 1-3 years. Bone density scan. This is done to screen for osteoporosis. You may have this done starting at age 22. Mammogram. This may be done every 1-2 years. Talk to your health care provider about how often you should have regular mammograms. Talk with your health care provider about your test results, treatment options, and if necessary, the need for more tests. Vaccines  Your health care provider may recommend certain vaccines, such as: Influenza vaccine. This is recommended every year. Tetanus, diphtheria, and acellular pertussis (Tdap, Td) vaccine. You may need a Td booster every 10 years. Zoster vaccine. You may need this after age 52. Pneumococcal 13-valent conjugate (PCV13) vaccine. One dose is recommended after age 26. Pneumococcal polysaccharide (PPSV23) vaccine.  One dose is recommended after age 2. Talk to your health care provider about which screenings and vaccines you need and how often you need them. This information is not intended to replace advice given to you by your health care provider. Make sure you discuss any questions you have with your health care provider. Document Released: 02/26/2015 Document Revised: 10/20/2015 Document Reviewed: 12/01/2014 Elsevier Interactive Patient Education  2017 [employer] Prevention in the Home Falls can cause injuries. They can happen to people of all ages. There are many things you can do to make your home safe and to help prevent falls. What can I do on the outside of my home? Regularly fix the edges of walkways and driveways and fix any cracks. Remove anything that might make you trip as you walk through a door, such as a raised step or threshold. Trim any bushes or trees on the path to your home. Use bright outdoor lighting. Clear any walking paths of anything that might make someone trip, such as rocks or tools. Regularly check to see if handrails are loose or broken. Make sure that both sides of any steps have handrails. Any raised decks and porches should have guardrails on the edges. Have any leaves, snow, or ice cleared regularly. Use sand or salt on walking paths during winter. Clean up any spills in your garage right away. This includes oil or grease spills. What can I do in the bathroom? Use night lights. Install grab bars by the toilet and in the tub and shower. Do not use towel bars as grab bars. Use non-skid mats or decals in the tub or shower. If you need to sit down in the shower, use a plastic, non-slip stool. Keep the floor dry. Clean up any water that spills on the floor as soon as it happens. Remove soap buildup in the tub or shower regularly. Attach bath mats securely with double-sided non-slip rug tape. Do not have throw rugs and other things on the floor that can make  you trip. What can I do in the bedroom? Use night lights. Make sure that you have a light by your bed that is easy to reach. Do not use any sheets or blankets that are too big for your bed. They should not hang down onto the floor. Have a firm chair that has side arms. You can use this for support while you get dressed. Do not have throw rugs and other things on the floor that can make you trip. What can I do in the kitchen? Clean up any spills right away. Avoid walking on wet floors. Keep items that you use a lot in easy-to-reach places. If you need to reach something above you, use a strong step stool that has a grab bar. Keep electrical cords out of the way. Do not use floor polish or wax that makes floors slippery. If you must use wax, use non-skid floor wax. Do not have throw rugs and other things on the floor that can make you trip. What can I do with my stairs? Do not leave any items on the stairs. Make sure that there are handrails on both sides of the stairs and use them. Fix handrails that are broken or loose. Make sure that handrails are as long as the stairways. Check any carpeting to make sure that it is firmly attached to the stairs. Fix any carpet that is loose or worn. Avoid having throw rugs at the top or bottom of the  stairs. If you do have throw rugs, attach them to the floor with carpet tape. Make sure that you have a light switch at the top of the stairs and the bottom of the stairs. If you do not have them, ask someone to add them for you. What else can I do to help prevent falls? Wear shoes that: Do not have high heels. Have rubber bottoms. Are comfortable and fit you well. Are closed at the toe. Do not wear sandals. If you use a stepladder: Make sure that it is fully opened. Do not climb a closed stepladder. Make sure that both sides of the stepladder are locked into place. Ask someone to hold it for you, if possible. Clearly mark and make sure that you can  see: Any grab bars or handrails. First and last steps. Where the edge of each step is. Use tools that help you move around (mobility aids) if they are needed. These include: Canes. Walkers. Scooters. Crutches. Turn on the lights when you go into a dark area. Replace any light bulbs as soon as they burn out. Set up your furniture so you have a clear path. Avoid moving your furniture around. If any of your floors are uneven, fix them. If there are any pets around you, be aware of where they are. Review your medicines with your doctor. Some medicines can make you feel dizzy. This can increase your chance of falling. Ask your doctor what other things that you can do to help prevent falls. This information is not intended to replace advice given to you by your health care provider. Make sure you discuss any questions you have with your health care provider. Document Released: 11/26/2008 Document Revised: 07/08/2015 Document Reviewed: 03/06/2014 Elsevier Interactive Patient Education  2017 [employer].

## 2021-02-01 ENCOUNTER — Ambulatory Visit (INDEPENDENT_AMBULATORY_CARE_PROVIDER_SITE_OTHER): Payer: Medicare HMO | Admitting: Nurse Practitioner

## 2021-02-01 ENCOUNTER — Ambulatory Visit: Payer: Medicare HMO

## 2021-02-01 ENCOUNTER — Other Ambulatory Visit: Payer: Self-pay

## 2021-02-01 ENCOUNTER — Encounter: Payer: Self-pay | Admitting: Nurse Practitioner

## 2021-02-01 DIAGNOSIS — R0981 Nasal congestion: Secondary | ICD-10-CM

## 2021-02-01 LAB — POC COVID19 BINAXNOW: SARS Coronavirus 2 Ag: NEGATIVE

## 2021-02-01 LAB — POC INFLUENZA A&B (BINAX/QUICKVUE)
Influenza A, POC: NEGATIVE
Influenza B, POC: NEGATIVE

## 2021-02-01 MED ORDER — CEFTRIAXONE SODIUM 500 MG IJ SOLR
500.0000 mg | Freq: Once | INTRAMUSCULAR | Status: AC
  Administered 2021-02-01: 13:00:00 500 mg via INTRAMUSCULAR

## 2021-02-01 MED ORDER — NOREL AD 4-10-325 MG PO TABS: 1.0000 | ORAL_TABLET | Freq: Two times a day (BID) | ORAL | 0 refills | Status: DC | PRN

## 2021-02-01 NOTE — Patient Instructions (Signed)

## 2021-02-01 NOTE — Progress Notes (Signed)
I,Katawbba Wiggins,acting as a Education administrator for [employer], FNP.,have documented all relevant documentation on the behalf of Minette Brine, FNP,as directed by  Minette Brine, FNP while in the presence of Minette Brine, [place].  This visit occurred during the SARS-CoV-2 public health emergency.  Safety protocols were in place, including screening questions prior to the visit, additional usage of staff PPE, and extensive cleaning of exam room while observing appropriate contact time as indicated for disinfecting solutions.  Subjective:     Patient ID: Alyssa Lee , female    DOB: Feb 09, 1951 , 70 y.o.   MRN: [id number]   Chief Complaint  Patient presents with   Sinus Problem    HPI  Patient is here for sinus congestion, began with a sore throat and sinus headache. She had a postnasal drip. Continues to have a headache on both sides of her head. Denies nausea. Denies shortness of breath.   Sinus Problem This is a new problem. The current episode started in the past 7 days (Saturday). There has been no fever. Associated symptoms include congestion, coughing (mucous productive but does not spit out.), sinus pressure and a sore throat. Pertinent negatives include no chills, headaches or shortness of breath. (When she blows her nose the secretions are clear. ) Past treatments include nothing.    Past Medical History:  Diagnosis Date   Hypertension      Family History  Problem Relation Age of Onset   Hyperthyroidism Mother    Hypertension Mother    Diabetes Mother    Hypertension Father    COPD Father    Kidney failure Father      Current Outpatient Medications:    NOREL AD 4-10-325 MG TABS, Take 1 tablet by mouth 2 (two) times daily as needed., Disp: 30 tablet, Rfl: 0   amLODipine (NORVASC) 10 MG tablet, TAKE 1 TABLET DAILY, Disp: 90 tablet, Rfl: 3   Cholecalciferol (VITAMIN D3) 5000 units CAPS, Take by mouth., Disp: , Rfl:    Elastic Bandages & Supports (THUMB SPLINT/RIGHT SMALL) MISC, 1  Units by Does not apply route daily., Disp: 1 each, Rfl: 0   fluticasone (FLONASE) 50 MCG/ACT nasal spray, USE 2 SPRAYS IN EACH NOSTRIL DAILY, Disp: 48 g, Rfl: 3   lisinopril-hydrochlorothiazide (ZESTORETIC) 20-25 MG tablet, TAKE 1 TABLET DAILY, Disp: 90 tablet, Rfl: 3   OVER THE COUNTER MEDICATION, Probiotic with cranberry  1 sometimes (Patient not taking: Reported on 01/27/2021), Disp: , Rfl:    vitamin C (ASCORBIC ACID) 500 MG tablet, Take 500 mg by mouth daily., Disp: , Rfl:    No Known Allergies   Review of Systems  Constitutional:  Positive for fatigue. Negative for chills.  HENT:  Positive for congestion, sinus pressure and sore throat.   Respiratory:  Positive for cough (mucous productive but does not spit out.). Negative for shortness of breath.   Cardiovascular: Negative.  Negative for chest pain.  Gastrointestinal: Negative.   Neurological:  Negative for dizziness and headaches.  Psychiatric/Behavioral: Negative.    All other systems reviewed and are negative.   There were no vitals filed for this visit. There is no height or weight on file to calculate BMI.   Objective:  Physical Exam Vitals reviewed.  Constitutional:      General: She is not in acute distress.    Appearance: Normal appearance.  HENT:     Nose: No congestion.     Right Sinus: Frontal sinus tenderness present. No maxillary sinus tenderness.     Left  Sinus: Frontal sinus tenderness present. No maxillary sinus tenderness.  Cardiovascular:     Rate and Rhythm: Normal rate and regular rhythm.     Pulses: Normal pulses.     Heart sounds: Normal heart sounds. No murmur heard. Pulmonary:     Effort: Pulmonary effort is normal. No respiratory distress.     Breath sounds: Normal breath sounds. No wheezing.  Skin:    Capillary Refill: Capillary refill takes less than 2 seconds.  Neurological:     General: No focal deficit present.     Mental Status: She is alert and oriented to person, place, and time.      Cranial Nerves: No cranial nerve deficit.     Motor: No weakness.  Psychiatric:        Mood and Affect: Mood normal.        Behavior: Behavior normal.        Thought Content: Thought content normal.        Judgment: Judgment normal.        Assessment And Plan:     1. Sinus congestion Comments: Negative rapid covid/influenza A/B will send PCR.  Due to the persisetent headache and pressure to sinuses will treat with ceftriaxone.  - POC COVID-19 - POC Influenza A&B(BINAX/QUICKVUE) - Novel Coronavirus, NAA ([facility]) - cefTRIAXone (ROCEPHIN) injection 500 mg     Patient was given opportunity to ask questions. Patient verbalized understanding of the plan and was able to repeat key elements of the plan. All questions were answered to their satisfaction.  Minette Brine, FNP   I, Minette Brine, FNP, have reviewed all documentation for this visit. The documentation on 02/01/2021 for the exam, diagnosis, procedures, and orders are all accurate and complete.   IF YOU HAVE BEEN REFERRED TO A SPECIALIST, IT MAY TAKE 1-2 WEEKS TO SCHEDULE/PROCESS THE REFERRAL. IF YOU HAVE NOT HEARD FROM US/SPECIALIST IN TWO WEEKS, PLEASE GIVE [place] A CALL AT [phone].   THE PATIENT IS ENCOURAGED TO PRACTICE SOCIAL DISTANCING DUE TO THE COVID-19 PANDEMIC.

## 2021-02-02 LAB — SARS-COV-2, NAA 2 DAY TAT

## 2021-02-02 LAB — NOVEL CORONAVIRUS, NAA: SARS-CoV-2, NAA: NOT DETECTED

## 2021-02-16 ENCOUNTER — Ambulatory Visit: Payer: Medicare HMO

## 2021-03-08 ENCOUNTER — Other Ambulatory Visit: Payer: Self-pay | Admitting: Internal Medicine

## 2021-05-11 ENCOUNTER — Ambulatory Visit (INDEPENDENT_AMBULATORY_CARE_PROVIDER_SITE_OTHER): Payer: Medicare HMO

## 2021-05-11 VITALS — BP 112/60 | HR 69 | Temp 97.9°F | Ht 59.2 in | Wt 161.0 lb

## 2021-05-11 DIAGNOSIS — Z0000 Encounter for general adult medical examination without abnormal findings: Secondary | ICD-10-CM | POA: Diagnosis not present

## 2021-05-11 NOTE — Progress Notes (Signed)
?This visit occurred during the SARS-CoV-2 public health emergency.  Safety protocols were in place, including screening questions prior to the visit, additional usage of staff PPE, and extensive cleaning of exam room while observing appropriate contact time as indicated for disinfecting solutions. ? ?Subjective:  ? Alyssa Lee is a 71 y.o. female who presents for Medicare Annual (Subsequent) preventive examination. ? ?Review of Systems    ? ?Cardiac Risk Factors include: advanced age (>6mn, >>5women);hypertension;obesity (BMI >30kg/m2) ? ?   ?Objective:  ?  ?Today's Vitals  ? 05/11/21 1513  ?BP: 112/60  ?Pulse: 69  ?Temp: 97.9 ?F (36.6 ?C)  ?TempSrc: Oral  ?SpO2: 97%  ?Weight: 161 lb (73 kg)  ?Height: 4' 11.2" (1.504 m)  ? ?Body mass index is 32.3 kg/m?. ? ? ?  05/11/2021  ?  3:22 PM 01/27/2021  ?  3:32 PM 12/24/2019  ?  3:54 PM 12/18/2018  ? 10:53 AM 12/12/2017  ? 12:50 PM  ?Advanced Directives  ?Does Patient Have a Medical Advance Directive? No No No No No  ?Would patient like information on creating a medical advance directive? No - Patient declined    Yes (MAU/Ambulatory/Procedural Areas - Information given)  ? ? ?Current Medications (verified) ?Outpatient Encounter Medications as of 05/11/2021  ?Medication Sig  ? amLODipine (NORVASC) 10 MG tablet TAKE 1 TABLET DAILY  ? Cholecalciferol (VITAMIN D3) 5000 units CAPS Take by mouth.  ? fluticasone (FLONASE) 50 MCG/ACT nasal spray USE 2 SPRAYS IN EACH NOSTRIL DAILY  ? lisinopril-hydrochlorothiazide (ZESTORETIC) 20-25 MG tablet TAKE 1 TABLET DAILY  ? NOREL AD 4-10-325 MG TABS Take 1 tablet by mouth 2 (two) times daily as needed.  ? vitamin C (ASCORBIC ACID) 500 MG tablet Take 500 mg by mouth daily.  ? Elastic Bandages & Supports (THUMB SPLINT/RIGHT SMALL) MISC 1 Units by Does not apply route daily.  ? OVER THE COUNTER MEDICATION Probiotic with cranberry  ?1 sometimes (Patient not taking: Reported on 01/27/2021)  ? ?No facility-administered encounter medications  on file as of 05/11/2021.  ? ? ?Allergies (verified) ?Patient has no known allergies.  ? ?History: ?Past Medical History:  ?Diagnosis Date  ? Hypertension   ? ?Past Surgical History:  ?Procedure Laterality Date  ? CESAREAN SECTION    ? PARTIAL HYSTERECTOMY  2009  ? TONSILLECTOMY  2004  ? ?Family History  ?Problem Relation Age of Onset  ? Hyperthyroidism Mother   ? Hypertension Mother   ? Diabetes Mother   ? Hypertension Father   ? COPD Father   ? Kidney failure Father   ? ?Social History  ? ?Socioeconomic History  ? Marital status: Married  ?  Spouse name: Not on file  ? Number of children: Not on file  ? Years of education: Not on file  ? Highest education level: Not on file  ?Occupational History  ? Occupation: retired  ?Tobacco Use  ? Smoking status: Never  ? Smokeless tobacco: Never  ?Vaping Use  ? Vaping Use: Never used  ?Substance and Sexual Activity  ? Alcohol use: Never  ? Drug use: Never  ? Sexual activity: Not Currently  ?Other Topics Concern  ? Not on file  ?Social History Narrative  ? Not on file  ? ?Social Determinants of Health  ? ?Financial Resource Strain: Low Risk   ? Difficulty of Paying Living Expenses: Not hard at all  ?Food Insecurity: No Food Insecurity  ? Worried About RCharity fundraiserin the Last Year: Never true  ? Ran  Out of Food in the Last Year: Never true  ?Transportation Needs: No Transportation Needs  ? Lack of Transportation (Medical): No  ? Lack of Transportation (Non-Medical): No  ?Physical Activity: Sufficiently Active  ? Days of Exercise per Week: 7 days  ? Minutes of Exercise per Session: 30 min  ?Stress: No Stress Concern Present  ? Feeling of Stress : Only a little  ?Social Connections: Not on file  ? ? ?Tobacco Counseling ?Counseling given: Not Answered ? ? ?Clinical Intake: ? ?Pre-visit preparation completed: Yes ? ?Pain : No/denies pain ? ?  ? ?Nutritional Status: BMI > 30  Obese ?Nutritional Risks: None ?Diabetes: No ? ?How often do you need to have someone help you when  you read instructions, pamphlets, or other written materials from your doctor or pharmacy?: 1 - Never ? ?Diabetic? no ? ?Interpreter Needed?: No ? ?Information entered by :: NAllen LPN ? ? ?Activities of Daily Living ? ?  05/11/2021  ?  3:23 PM 01/27/2021  ?  3:34 PM  ?In your present state of health, do you have any difficulty performing the following activities:  ?Hearing? 0 0  ?Vision? 0 0  ?Difficulty concentrating or making decisions? 0 0  ?Walking or climbing stairs? 0 0  ?Dressing or bathing? 0 0  ?Doing errands, shopping? 0 0  ?Preparing Food and eating ? N N  ?Using the Toilet? N N  ?In the past six months, have you accidently leaked urine? N N  ?Comment only with bad cough   ?Do you have problems with loss of bowel control? N N  ?Managing your Medications? N N  ?Managing your Finances? N N  ?Housekeeping or managing your Housekeeping? N N  ? ? ?Patient Care Team: ?Glendale Chard, MD as PCP - General (Internal Medicine) ? ?Indicate any recent Medical Services you may have received from other than [facility] providers in the past year (date may be approximate). ? ?   ?Assessment:  ? This is a routine wellness examination for Alyssa Lee. ? ?Hearing/Vision screen ?Vision Screening - Comments:: Regular eye exams, [facility] ? ?Dietary issues and exercise activities discussed: ?Current Exercise Habits: Home exercise routine, Type of exercise: walking, Time (Minutes): 30, Frequency (Times/Week): 7, Weekly Exercise (Minutes/Week): 210 ? ? Goals Addressed   ? ?  ?  ?  ?  ? This Visit's Progress  ?  Patient Stated     ?  05/11/2021, wants to go more often silver sneaker ?  ? ?  ? ?Depression Screen ? ?  05/11/2021  ?  3:23 PM 01/27/2021  ?  3:33 PM 12/24/2019  ?  3:56 PM 12/24/2019  ?  3:00 PM 12/18/2018  ? 10:54 AM 06/20/2018  ?  2:42 PM 03/14/2018  ? 11:00 AM  ?PHQ 2/9 Scores  ?PHQ - 2 Score 0 0 0 0 0 0 0  ?PHQ- 9 Score     0    ?  ?Fall Risk ? ?  05/11/2021  ?  3:23 PM 01/27/2021  ?  3:33 PM 12/24/2019  ?  3:55 PM 06/17/2019  ? 10:02  AM 12/18/2018  ? 10:54 AM  ?Fall Risk   ?Falls in the past year? 0 0 0 0 0  ?Number falls in past yr:     0  ?Risk for fall due to : Medication side effect Medication side effect Medication side effect  Medication side effect  ?Follow up Falls evaluation completed;Education provided;Falls prevention discussed Falls evaluation completed;Education provided;Falls prevention discussed Falls  evaluation completed;Falls prevention discussed;Education provided  Falls evaluation completed;Education provided;Falls prevention discussed  ? ? ?FALL RISK PREVENTION PERTAINING TO THE HOME: ? ?Any stairs in or around the home? Yes  ?If so, are there any without handrails? No  ?Home free of loose throw rugs in walkways, pet beds, electrical cords, etc? Yes  ?Adequate lighting in your home to reduce risk of falls? Yes  ? ?ASSISTIVE DEVICES UTILIZED TO PREVENT FALLS: ? ?Life alert? No  ?Use of a cane, walker or w/c? No  ?Grab bars in the bathroom? No  ?Shower chair or bench in shower? Yes  ?Elevated toilet seat or a handicapped toilet? Yes  ? ?TIMED UP AND GO: ? ?Was the test performed? No .  ? ? ?Gait steady and fast without use of assistive device ? ?Cognitive Function: ?  ?  ? ?  05/11/2021  ?  3:25 PM 01/27/2021  ?  3:35 PM 12/24/2019  ?  3:56 PM 12/18/2018  ? 10:56 AM 12/12/2017  ? 12:54 PM  ?6CIT Screen  ?What Year? 0 points 0 points 0 points 0 points 0 points  ?What month? 0 points 0 points 0 points 0 points 0 points  ?What time? 0 points 0 points 0 points 0 points 0 points  ?Count back from 20 0 points 0 points 0 points 0 points   ?Months in reverse 0 points 0 points 0 points 0 points 0 points  ?Repeat phrase 0 points 0 points 0 points 0 points 0 points  ?Total Score 0 points 0 points 0 points 0 points   ? ? ?Immunizations ?Immunization History  ?Administered Date(s) Administered  ? Influenza, High Dose Seasonal PF 12/12/2017, 12/18/2018  ? Moderna SARS-COV2 Booster Vaccination 01/23/2020, 09/07/2020  ? Moderna Sars-Covid-2  Vaccination 03/30/2019, 04/27/2019  ? ? ?TDAP status: Due, Education has been provided regarding the importance of this vaccine. Advised may receive this vaccine at local pharmacy or Health Dept. Aware to pr

## 2021-05-11 NOTE — Patient Instructions (Signed)
Alyssa Lee , ?Thank you for taking time to come for your Medicare Wellness Visit. I appreciate your ongoing commitment to your health goals. Please review the following plan we discussed and let me know if I can assist you in the future.  ? ?Screening recommendations/referrals: ?Colonoscopy: cologuard due ?Mammogram: due ?Bone Density: due ?Recommended yearly ophthalmology/optometry visit for glaucoma screening and checkup ?Recommended yearly dental visit for hygiene and checkup ? ?Vaccinations: ?Influenza vaccine: due next flu season ?Pneumococcal vaccine: due ?Tdap vaccine: due ?Shingles vaccine: discussed   ?Covid-19: 09/07/2020, 01/23/2020, 04/27/2019, 03/30/2019 ? ?Advanced directives: Advance directive discussed with you today. Even though you declined this today please call our office should you change your mind and we can give you the proper paperwork for you to fill out. ? ?Conditions/risks identified: none ? ?Next appointment: Follow up in one year for your annual wellness visit  ? ? ?Preventive Care 71 Years and Older, Female ?Preventive care refers to lifestyle choices and visits with your health care provider that can promote health and wellness. ?What does preventive care include? ?A yearly physical exam. This is also called an annual well check. ?Dental exams once or twice a year. ?Routine eye exams. Ask your health care provider how often you should have your eyes checked. ?Personal lifestyle choices, including: ?Daily care of your teeth and gums. ?Regular physical activity. ?Eating a healthy diet. ?Avoiding tobacco and drug use. ?Limiting alcohol use. ?Practicing safe sex. ?Taking low-dose aspirin every day. ?Taking vitamin and mineral supplements as recommended by your health care provider. ?What happens during an annual well check? ?The services and screenings done by your health care provider during your annual well check will depend on your age, overall health, lifestyle risk factors, and family  history of disease. ?Counseling  ?Your health care provider may ask you questions about your: ?Alcohol use. ?Tobacco use. ?Drug use. ?Emotional well-being. ?Home and relationship well-being. ?Sexual activity. ?Eating habits. ?History of falls. ?Memory and ability to understand (cognition). ?Work and work Statistician. ?Reproductive health. ?Screening  ?You may have the following tests or measurements: ?Height, weight, and BMI. ?Blood pressure. ?Lipid and cholesterol levels. These may be checked every 5 years, or more frequently if you are over 54 years old. ?Skin check. ?Lung cancer screening. You may have this screening every year starting at age 18 if you have a 30-pack-year history of smoking and currently smoke or have quit within the past 15 years. ?Fecal occult blood test (FOBT) of the stool. You may have this test every year starting at age 52. ?Flexible sigmoidoscopy or colonoscopy. You may have a sigmoidoscopy every 5 years or a colonoscopy every 10 years starting at age 46. ?Hepatitis C blood test. ?Hepatitis B blood test. ?Sexually transmitted disease (STD) testing. ?Diabetes screening. This is done by checking your blood sugar (glucose) after you have not eaten for a while (fasting). You may have this done every 1-3 years. ?Bone density scan. This is done to screen for osteoporosis. You may have this done starting at age 67. ?Mammogram. This may be done every 1-2 years. Talk to your health care provider about how often you should have regular mammograms. ?Talk with your health care provider about your test results, treatment options, and if necessary, the need for more tests. ?Vaccines  ?Your health care provider may recommend certain vaccines, such as: ?Influenza vaccine. This is recommended every year. ?Tetanus, diphtheria, and acellular pertussis (Tdap, Td) vaccine. You may need a Td booster every 10 years. ?Zoster vaccine. You  may need this after age 55. ?Pneumococcal 13-valent conjugate (PCV13)  vaccine. One dose is recommended after age 60. ?Pneumococcal polysaccharide (PPSV23) vaccine. One dose is recommended after age 30. ?Talk to your health care provider about which screenings and vaccines you need and how often you need them. ?This information is not intended to replace advice given to you by your health care provider. Make sure you discuss any questions you have with your health care provider. ?Document Released: 02/26/2015 Document Revised: 10/20/2015 Document Reviewed: 12/01/2014 ?Elsevier Interactive Patient Education ? 2017 [place]. ? ?Fall Prevention in the Home ?Falls can cause injuries. They can happen to people of all ages. There are many things you can do to make your home safe and to help prevent falls. ?What can I do on the outside of my home? ?Regularly fix the edges of walkways and driveways and fix any cracks. ?Remove anything that might make you trip as you walk through a door, such as a raised step or threshold. ?Trim any bushes or trees on the path to your home. ?Use bright outdoor lighting. ?Clear any walking paths of anything that might make someone trip, such as rocks or tools. ?Regularly check to see if handrails are loose or broken. Make sure that both sides of any steps have handrails. ?Any raised decks and porches should have guardrails on the edges. ?Have any leaves, snow, or ice cleared regularly. ?Use sand or salt on walking paths during winter. ?Clean up any spills in your garage right away. This includes oil or grease spills. ?What can I do in the bathroom? ?Use night lights. ?Install grab bars by the toilet and in the tub and shower. Do not use towel bars as grab bars. ?Use non-skid mats or decals in the tub or shower. ?If you need to sit down in the shower, use a plastic, non-slip stool. ?Keep the floor dry. Clean up any water that spills on the floor as soon as it happens. ?Remove soap buildup in the tub or shower regularly. ?Attach bath mats securely with  double-sided non-slip rug tape. ?Do not have throw rugs and other things on the floor that can make you trip. ?What can I do in the bedroom? ?Use night lights. ?Make sure that you have a light by your bed that is easy to reach. ?Do not use any sheets or blankets that are too big for your bed. They should not hang down onto the floor. ?Have a firm chair that has side arms. You can use this for support while you get dressed. ?Do not have throw rugs and other things on the floor that can make you trip. ?What can I do in the kitchen? ?Clean up any spills right away. ?Avoid walking on wet floors. ?Keep items that you use a lot in easy-to-reach places. ?If you need to reach something above you, use a strong step stool that has a grab bar. ?Keep electrical cords out of the way. ?Do not use floor polish or wax that makes floors slippery. If you must use wax, use non-skid floor wax. ?Do not have throw rugs and other things on the floor that can make you trip. ?What can I do with my stairs? ?Do not leave any items on the stairs. ?Make sure that there are handrails on both sides of the stairs and use them. Fix handrails that are broken or loose. Make sure that handrails are as long as the stairways. ?Check any carpeting to make sure that it is firmly  attached to the stairs. Fix any carpet that is loose or worn. ?Avoid having throw rugs at the top or bottom of the stairs. If you do have throw rugs, attach them to the floor with carpet tape. ?Make sure that you have a light switch at the top of the stairs and the bottom of the stairs. If you do not have them, ask someone to add them for you. ?What else can I do to help prevent falls? ?Wear shoes that: ?Do not have high heels. ?Have rubber bottoms. ?Are comfortable and fit you well. ?Are closed at the toe. Do not wear sandals. ?If you use a stepladder: ?Make sure that it is fully opened. Do not climb a closed stepladder. ?Make sure that both sides of the stepladder are locked  into place. ?Ask someone to hold it for you, if possible. ?Clearly mark and make sure that you can see: ?Any grab bars or handrails. ?First and last steps. ?Where the edge of each step is. ?Use tools that help you m

## 2021-06-06 ENCOUNTER — Other Ambulatory Visit: Payer: Self-pay | Admitting: Internal Medicine

## 2021-06-14 ENCOUNTER — Ambulatory Visit (INDEPENDENT_AMBULATORY_CARE_PROVIDER_SITE_OTHER): Payer: Medicare HMO | Admitting: Nurse Practitioner

## 2021-06-14 ENCOUNTER — Encounter: Payer: Self-pay | Admitting: Nurse Practitioner

## 2021-06-14 VITALS — BP 130/68 | HR 64 | Temp 98.1°F | Ht 59.2 in | Wt 161.0 lb

## 2021-06-14 DIAGNOSIS — N1831 Chronic kidney disease, stage 3a: Secondary | ICD-10-CM | POA: Diagnosis not present

## 2021-06-14 DIAGNOSIS — I129 Hypertensive chronic kidney disease with stage 1 through stage 4 chronic kidney disease, or unspecified chronic kidney disease: Secondary | ICD-10-CM

## 2021-06-14 DIAGNOSIS — Z0000 Encounter for general adult medical examination without abnormal findings: Secondary | ICD-10-CM

## 2021-06-14 DIAGNOSIS — R7309 Other abnormal glucose: Secondary | ICD-10-CM | POA: Diagnosis not present

## 2021-06-14 DIAGNOSIS — Z79899 Other long term (current) drug therapy: Secondary | ICD-10-CM

## 2021-06-14 DIAGNOSIS — E7800 Pure hypercholesterolemia, unspecified: Secondary | ICD-10-CM

## 2021-06-14 DIAGNOSIS — Z6832 Body mass index (BMI) 32.0-32.9, adult: Secondary | ICD-10-CM

## 2021-06-14 DIAGNOSIS — E2839 Other primary ovarian failure: Secondary | ICD-10-CM

## 2021-06-14 DIAGNOSIS — N289 Disorder of kidney and ureter, unspecified: Secondary | ICD-10-CM

## 2021-06-14 DIAGNOSIS — Z1231 Encounter for screening mammogram for malignant neoplasm of breast: Secondary | ICD-10-CM

## 2021-06-14 DIAGNOSIS — E6609 Other obesity due to excess calories: Secondary | ICD-10-CM

## 2021-06-14 LAB — POCT URINALYSIS DIPSTICK
Bilirubin, UA: NEGATIVE
Blood, UA: NEGATIVE
Glucose, UA: NEGATIVE
Ketones, UA: NEGATIVE
Leukocytes, UA: NEGATIVE
Nitrite, UA: NEGATIVE
Protein, UA: NEGATIVE
Spec Grav, UA: 1.015 (ref 1.010–1.025)
Urobilinogen, UA: 0.2 E.U./dL
pH, UA: 7 (ref 5.0–8.0)

## 2021-06-14 MED ORDER — LISINOPRIL-HYDROCHLOROTHIAZIDE 20-25 MG PO TABS: 1.0000 | ORAL_TABLET | Freq: Every day | ORAL | 3 refills | Status: DC

## 2021-06-14 MED ORDER — AMLODIPINE BESYLATE 10 MG PO TABS: 10.0000 mg | ORAL_TABLET | Freq: Every day | ORAL | 3 refills | Status: DC

## 2021-06-14 MED ORDER — FLUTICASONE PROPIONATE 50 MCG/ACT NA SUSP: 2.0000 | Freq: Every day | NASAL | 1 refills | Status: DC

## 2021-06-14 NOTE — Progress Notes (Signed)
I,Tianna Badgett,acting as a Education administrator for [employer], FNP.,have documented all relevant documentation on the behalf of Minette Brine, FNP,as directed by  Minette Brine, FNP while in the presence of Minette Brine, [place].  This visit occurred during the SARS-CoV-2 public health emergency.  Safety protocols were in place, including screening questions prior to the visit, additional usage of staff PPE, and extensive cleaning of exam room while observing appropriate contact time as indicated for disinfecting solutions.  Subjective:     Patient ID: Alyssa Lee , female    DOB: Sep 22, 1950 , 71 y.o.   MRN: [id number]   Chief Complaint  Patient presents with   Annual Exam    HPI  The patient is here today for a physical examination. She is no longer followed by GYN. She has no specific concerns or complaints at this time. She denies chest pain, palpitations and shortness of breath.   Hypertension This is a chronic problem. The current episode started more than 1 year ago. The problem has been gradually improving since onset. The problem is controlled. Pertinent negatives include no blurred vision, chest pain, palpitations or shortness of breath. The current treatment provides moderate improvement.    Past Medical History:  Diagnosis Date   Hypertension      Family History  Problem Relation Age of Onset   Hyperthyroidism Mother    Hypertension Mother    Diabetes Mother    Hypertension Father    COPD Father    Kidney failure Father      Current Outpatient Medications:    Cholecalciferol (VITAMIN D3) 5000 units CAPS, Take by mouth., Disp: , Rfl:    Elastic Bandages & Supports (THUMB SPLINT/RIGHT SMALL) MISC, 1 Units by Does not apply route daily., Disp: 1 each, Rfl: 0   NOREL AD 4-10-325 MG TABS, Take 1 tablet by mouth 2 (two) times daily as needed., Disp: 30 tablet, Rfl: 0   OVER THE COUNTER MEDICATION, Probiotic with cranberry  1 sometimes, Disp: , Rfl:    vitamin C (ASCORBIC ACID) 500  MG tablet, Take 500 mg by mouth daily., Disp: , Rfl:    amLODipine (NORVASC) 10 MG tablet, Take 1 tablet (10 mg total) by mouth daily., Disp: 90 tablet, Rfl: 3   fluticasone (FLONASE) 50 MCG/ACT nasal spray, Place 2 sprays into both nostrils daily., Disp: 48 g, Rfl: 1   lisinopril-hydrochlorothiazide (ZESTORETIC) 20-25 MG tablet, Take 1 tablet by mouth daily., Disp: 90 tablet, Rfl: 3   No Known Allergies    The patient states she is post menopausal status.  No LMP recorded. Patient is premenopausal.. Negative for Dysmenorrhea and Negative for Menorrhagia. Negative for: breast discharge, breast lump(s), breast pain and breast self exam. Associated symptoms include abnormal vaginal bleeding. Pertinent negatives include abnormal bleeding (hematology), anxiety, decreased libido, depression, difficulty falling sleep, dyspareunia, history of infertility, nocturia, sexual dysfunction, sleep disturbances, urinary incontinence, urinary urgency, vaginal discharge and vaginal itching. Diet regular. 3 days a week she does not eat meat.  She has 2-3 vegetables when she eats. Does eat increased vegetables chicken and fish little pork and beef. Eats good amount of salads. Drink about 3 bottles of water daily.  The patient states her exercise level is minimal - walking - will walk him 2-3 times a day. She used to walk longer distances. She is working at [employer] and has stand up desk.   The patient's tobacco use is:  Social History   Tobacco Use  Smoking Status Never  Smokeless Tobacco  Never   She has been exposed to passive smoke. The patient's alcohol use is:  Social History   Substance and Sexual Activity  Alcohol Use Never    Review of Systems  Constitutional: Negative.   HENT: Negative.    Eyes: Negative.  Negative for blurred vision.  Respiratory: Negative.  Negative for shortness of breath.   Cardiovascular: Negative.  Negative for chest pain and palpitations.  Gastrointestinal: Negative.    Endocrine: Negative.   Genitourinary: Negative.   Musculoskeletal: Negative.   Skin: Negative.   Allergic/Immunologic: Negative.   Neurological: Negative.   Hematological: Negative.   Psychiatric/Behavioral: Negative.      Today's Vitals   06/14/21 1050  BP: 130/68  Pulse: 64  Temp: 98.1 F (36.7 C)  TempSrc: Oral  Weight: 161 lb (73 kg)  Height: 4' 11.2" (1.504 m)   Body mass index is 32.3 kg/m.  Wt Readings from Last 3 Encounters:  06/14/21 161 lb (73 kg)  05/11/21 161 lb (73 kg)  01/27/21 160 lb (72.6 kg)    Objective:  Physical Exam Constitutional:      General: She is not in acute distress.    Appearance: Normal appearance. She is well-developed. She is obese.  HENT:     Head: Normocephalic and atraumatic.     Right Ear: Hearing, tympanic membrane, ear canal and external ear normal. There is no impacted cerumen.     Left Ear: Hearing, tympanic membrane, ear canal and external ear normal. There is no impacted cerumen.     Nose:     Comments: Deferred - masked    Mouth/Throat:     Comments: Deferred - masked Eyes:     General: Lids are normal.     Extraocular Movements: Extraocular movements intact.     Conjunctiva/sclera: Conjunctivae normal.     Pupils: Pupils are equal, round, and reactive to light.     Funduscopic exam:    Right eye: No papilledema.        Left eye: No papilledema.  Neck:     Thyroid: No thyroid mass.     Vascular: No carotid bruit.  Cardiovascular:     Rate and Rhythm: Normal rate and regular rhythm.     Pulses: Normal pulses.     Heart sounds: Normal heart sounds. No murmur heard. Pulmonary:     Effort: Pulmonary effort is normal.     Breath sounds: Normal breath sounds.  Chest:     Chest wall: No mass.  Breasts:    Tanner Score is 5.     Right: Normal. No mass or tenderness.     Left: Normal. No mass or tenderness.  Abdominal:     General: Abdomen is flat. Bowel sounds are normal. There is no distension.     Palpations:  Abdomen is soft.     Tenderness: There is no abdominal tenderness.  Musculoskeletal:        General: No swelling. Normal range of motion.     Cervical back: Full passive range of motion without pain, normal range of motion and neck supple.     Right lower leg: No edema.     Left lower leg: No edema.  Lymphadenopathy:     Upper Body:     Right upper body: No supraclavicular, axillary or pectoral adenopathy.     Left upper body: No supraclavicular, axillary or pectoral adenopathy.  Skin:    General: Skin is warm and dry.     Capillary Refill: Capillary refill  takes less than 2 seconds.  Neurological:     General: No focal deficit present.     Mental Status: She is alert and oriented to person, place, and time.     Cranial Nerves: No cranial nerve deficit.     Sensory: No sensory deficit.  Psychiatric:        Mood and Affect: Mood normal.        Behavior: Behavior normal.        Thought Content: Thought content normal.        Judgment: Judgment normal.        Assessment And Plan:     1. Encounter for annual physical exam Behavior modifications discussed and diet history reviewed.   Pt will continue to exercise regularly and modify diet with low GI, plant based foods and decrease intake of processed foods.  Recommend intake of daily multivitamin, Vitamin D, and calcium.  Recommend mammogram and colonoscopy for preventive screenings, as well as recommend immunizations that include influenza, TDAP, and Shingles  2. Class 1 obesity due to excess calories with serious comorbidity and body mass index (BMI) of 32.0 to 32.9 in adult Chronic Discussed healthy diet and regular exercise options  Encouraged to exercise at least 150 minutes per week with 2 days of strength training She is encouraged to initially strive for BMI less than 30 to decrease cardiac risk.   3. Encounter for screening mammogram for breast cancer Pt instructed on Self Breast Exam.According to ACOG guidelines Women  aged 26 and older are recommended to get an annual mammogram. Form completed and given to patient contact the [facility] for appointment scheduing.  Pt encouraged to get annual mammogram - MM DIGITAL SCREENING BILATERAL; Future  4. Decreased estrogen level - DG Bone Density; Future  5. Hypertensive nephropathy Comments: Blood pressure is controlled, continue current medications EKG done with NSR HR 60 - EKG 12-Lead - POCT Urinalysis Dipstick (81002) - Microalbumin / Creatinine Urine Ratio - Lipid panel - lisinopril-hydrochlorothiazide (ZESTORETIC) 20-25 MG tablet; Take 1 tablet by mouth daily.  Dispense: 90 tablet; Refill: 3 - amLODipine (NORVASC) 10 MG tablet; Take 1 tablet (10 mg total) by mouth daily.  Dispense: 90 tablet; Refill: 3  6. Stage 3a chronic kidney disease (HCC) - CMP14+EGFR  7. Other abnormal glucose Comments: Stable, no current medications. Continue avoiding sugary foods and drinks - Hemoglobin A1c  8. Elevated LDL cholesterol level Comments: Stable, diet controlled. Continue to limit intake of fried and fatty foods.   9. Other long term (current) drug therapy - CBC    Patient was given opportunity to ask questions. Patient verbalized understanding of the plan and was able to repeat key elements of the plan. All questions were answered to their satisfaction.   Minette Brine, FNP   I, Minette Brine, FNP, have reviewed all documentation for this visit. The documentation on 06/14/21 for the exam, diagnosis, procedures, and orders are all accurate and complete.   THE PATIENT IS ENCOURAGED TO PRACTICE SOCIAL DISTANCING DUE TO THE COVID-19 PANDEMIC.

## 2021-06-14 NOTE — Patient Instructions (Signed)

## 2021-06-15 LAB — MICROALBUMIN / CREATININE URINE RATIO
Creatinine, Urine: 27.9 mg/dL
Microalb/Creat Ratio: <11 mg/g creat (ref 0–29)
Microalbumin, Urine: <3 ug/mL

## 2021-06-15 LAB — CBC
Hematocrit: 41.1 % (ref 34.0–46.6)
Hemoglobin: 13.5 g/dL (ref 11.1–15.9)
MCH: 29 pg (ref 26.6–33.0)
MCHC: 32.8 g/dL (ref 31.5–35.7)
MCV: 88 fL (ref 79–97)
Platelets: 285 10*3/uL (ref 150–450)
RBC: 4.66 x10E6/uL (ref 3.77–5.28)
RDW: 13.1 % (ref 11.7–15.4)
WBC: 4 10*3/uL (ref 3.4–10.8)

## 2021-06-15 LAB — LIPID PANEL
Chol/HDL Ratio: 3.4 ratio (ref 0.0–4.4)
Cholesterol, Total: 219 mg/dL — ABNORMAL HIGH (ref 100–199)
HDL: 65 mg/dL (ref >39)
LDL Chol Calc (NIH): 137 mg/dL — ABNORMAL HIGH (ref 0–99)
Triglycerides: 94 mg/dL (ref 0–149)
VLDL Cholesterol Cal: 17 mg/dL (ref 5–40)

## 2021-06-15 LAB — CMP14+EGFR
ALT: 13 IU/L (ref 0–32)
AST: 14 IU/L (ref 0–40)
Albumin/Globulin Ratio: 1.5 (ref 1.2–2.2)
Albumin: 4.6 g/dL (ref 3.7–4.7)
Alkaline Phosphatase: 53 IU/L (ref 44–121)
BUN/Creatinine Ratio: 12 (ref 12–28)
BUN: 13 mg/dL (ref 8–27)
Bilirubin Total: 0.3 mg/dL (ref 0.0–1.2)
CO2: 27 mmol/L (ref 20–29)
Calcium: 10.3 mg/dL (ref 8.7–10.3)
Chloride: 98 mmol/L (ref 96–106)
Creatinine, Ser: 1.07 mg/dL — ABNORMAL HIGH (ref 0.57–1.00)
Globulin, Total: 3 g/dL (ref 1.5–4.5)
Glucose: 87 mg/dL (ref 70–99)
Potassium: 4.5 mmol/L (ref 3.5–5.2)
Sodium: 139 mmol/L (ref 134–144)
Total Protein: 7.6 g/dL (ref 6.0–8.5)
eGFR: 56 mL/min/{1.73_m2} — ABNORMAL LOW (ref >59)

## 2021-06-15 LAB — HEMOGLOBIN A1C
Est. average glucose Bld gHb Est-mCnc: 126 mg/dL
Hgb A1c MFr Bld: 6 % — ABNORMAL HIGH (ref 4.8–5.6)

## 2021-08-17 IMAGING — US US RENAL
1 series · 14 of 25 positions shown · non-contrast
Comparison: None

CLINICAL DATA: Renal insufficiency, history hypertension

EXAM:
RENAL / URINARY TRACT ULTRASOUND COMPLETE

[Series 1: us renal · 0.18mm/px · 14 of 31 slices shown]
[im 1/31]
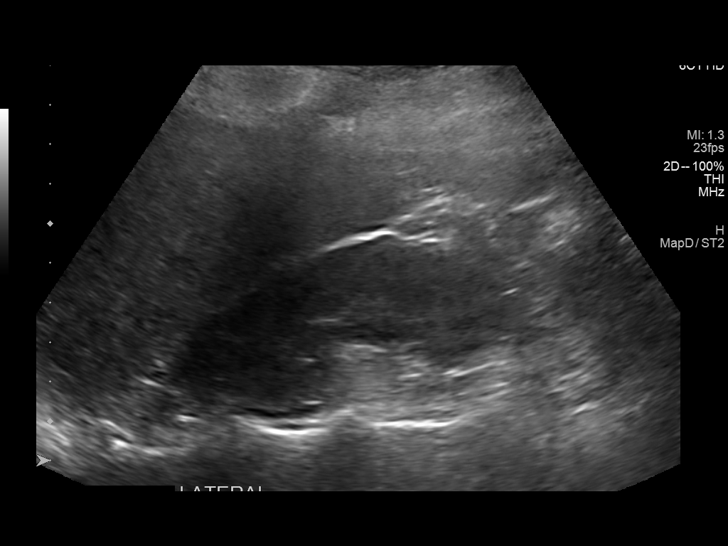
[im 3/31]
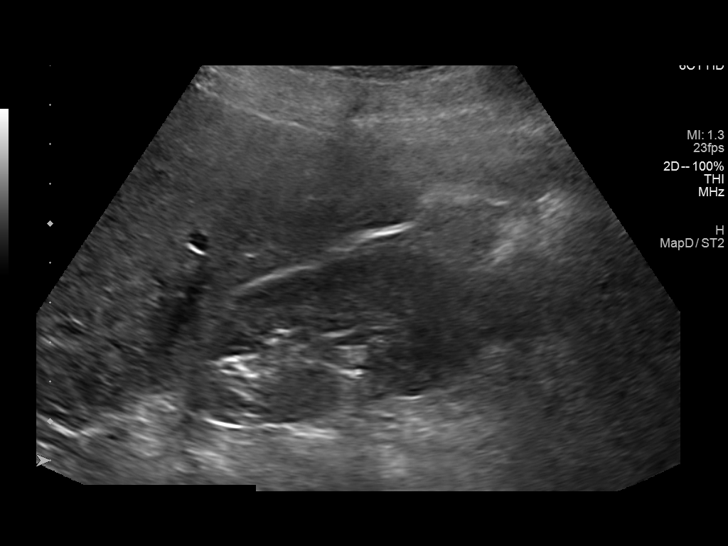
[im 6/31]
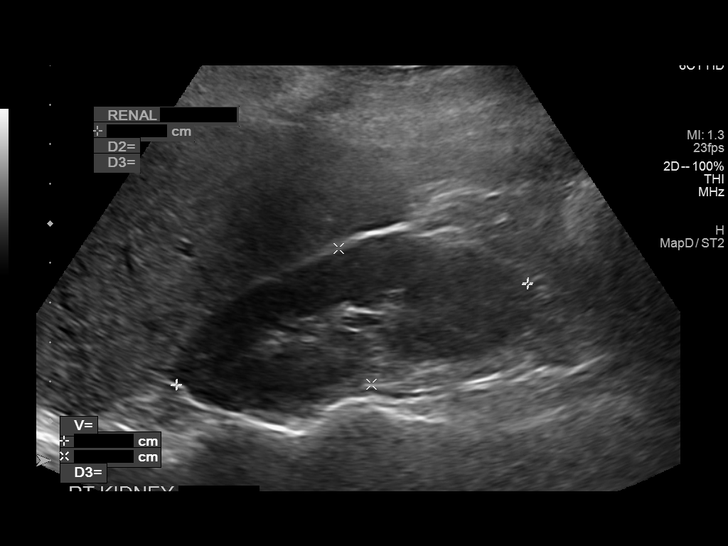
[im 8/31]
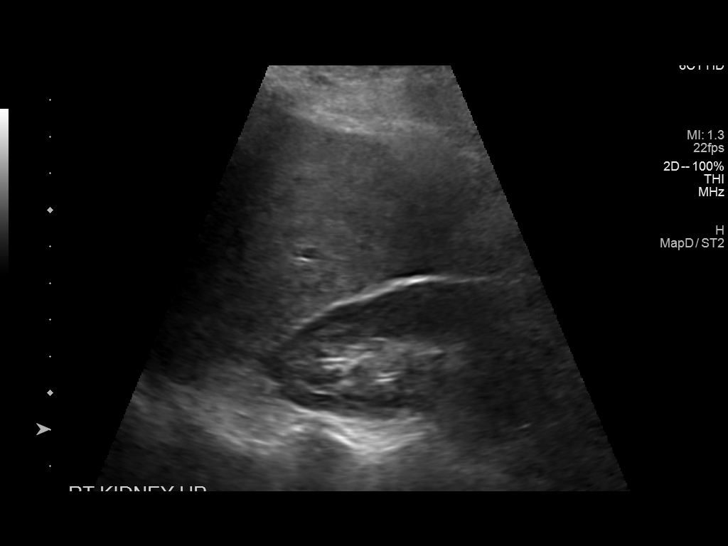
[im 11/31]
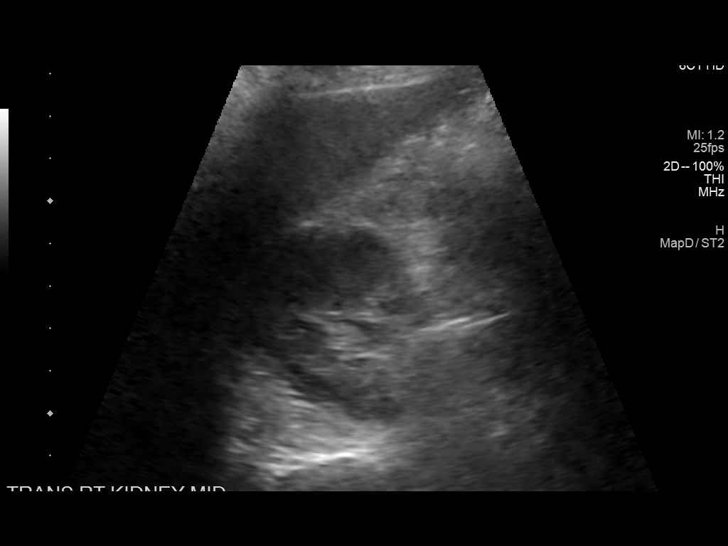
[im 12/31]
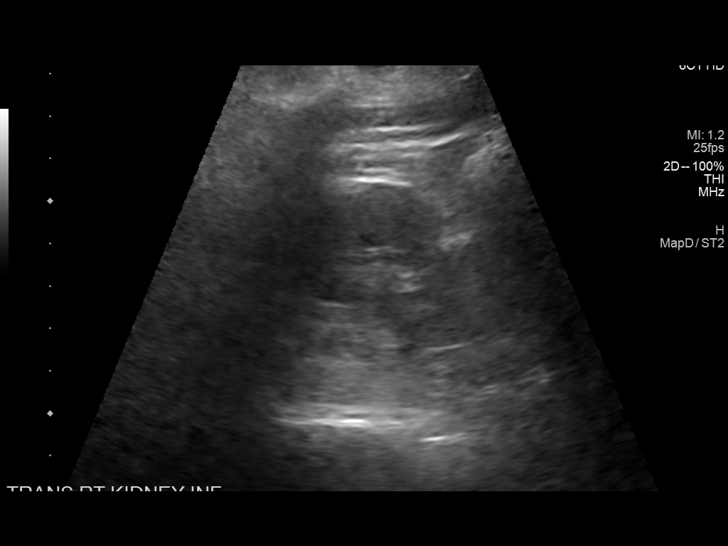
[im 14/31]
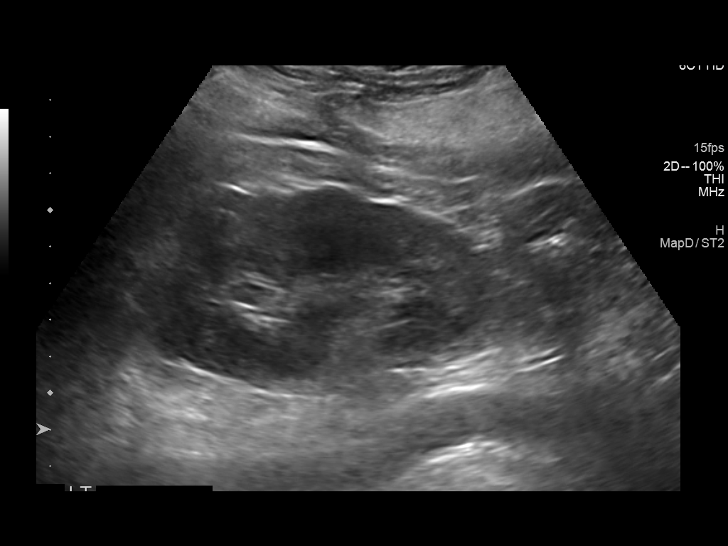
[im 17/31]
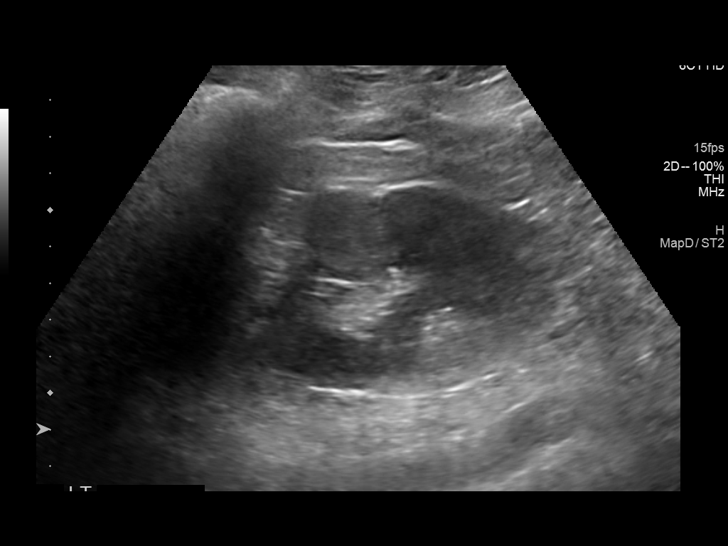
[im 19/31]
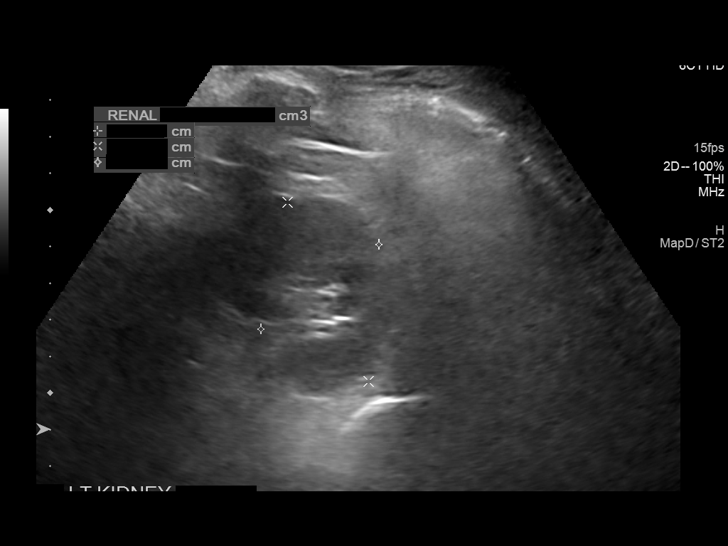
[im 21/31]
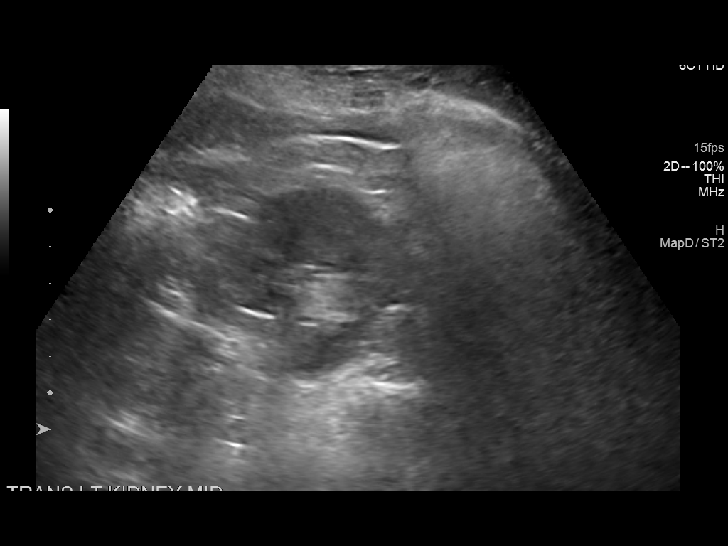
[im 23/31]
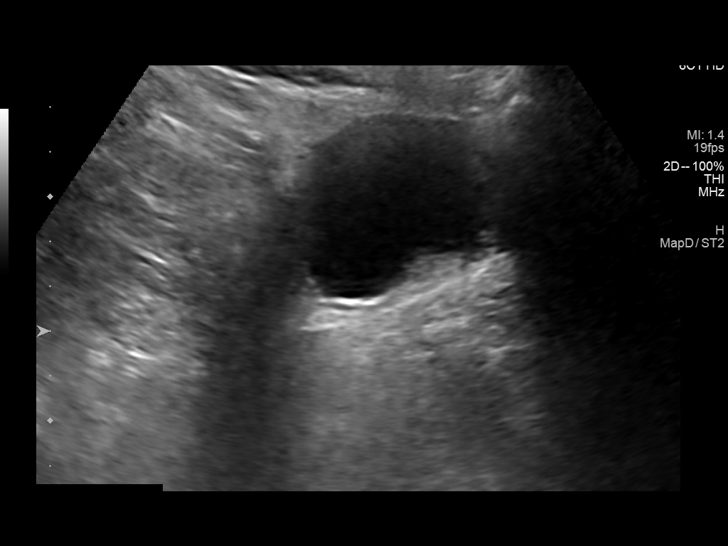
[im 26/31]
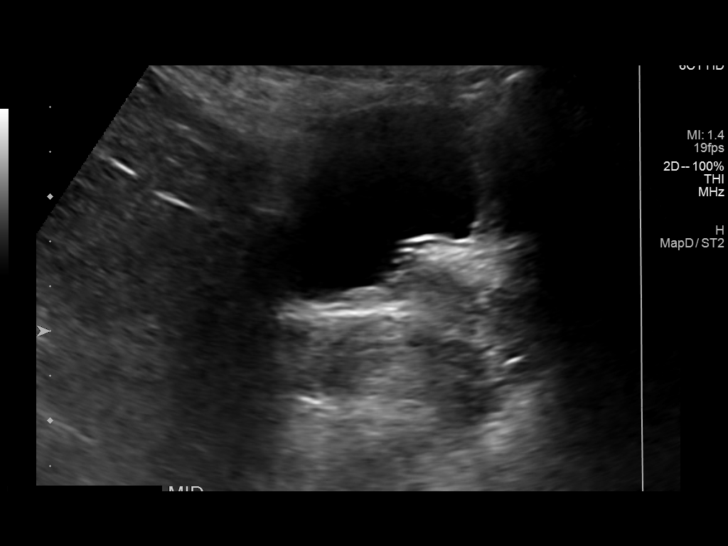
[im 28/31]
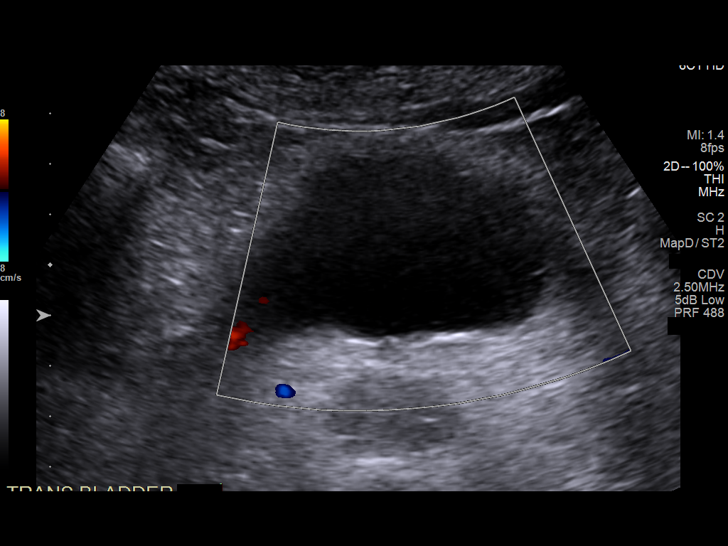
[im 31/31]
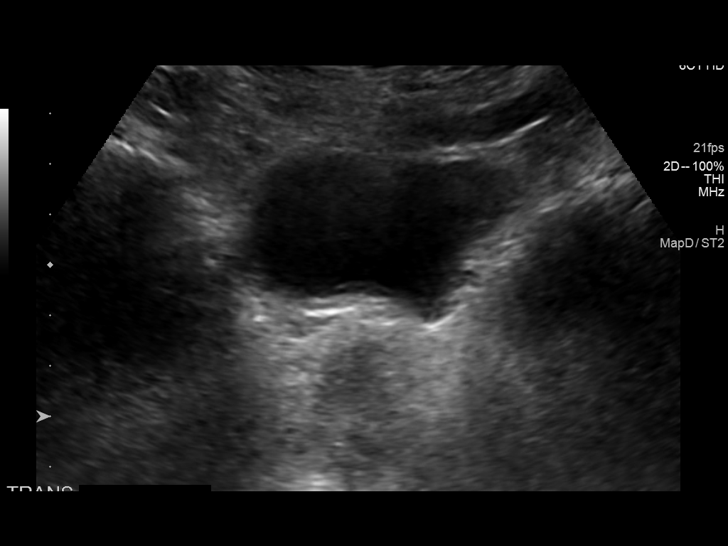

[14 of 25 positions shown; findings below may reference images not displayed]

FINDINGS: Right Kidney:

Renal measurements: 9.2 x 3.5 x 4.4 cm = volume: 74 mL. Normal
cortical thickness and echogenicity. No mass, hydronephrosis, or
shadowing calcification.

Left Kidney:

Renal measurements: 9.9 x 5.4 x 4.0 cm = volume: 110 mL. Normal
cortical thickness and echogenicity. No mass, hydronephrosis, or
shadowing calcification.

Bladder:

Appears normal for degree of bladder distention.

Other:

N/A
IMPRESSION: Normal exam.

## 2021-10-09 ENCOUNTER — Other Ambulatory Visit: Payer: Self-pay | Admitting: Internal Medicine

## 2021-10-09 DIAGNOSIS — Z1211 Encounter for screening for malignant neoplasm of colon: Secondary | ICD-10-CM

## 2021-10-18 ENCOUNTER — Ambulatory Visit: Payer: Medicare HMO | Admitting: Internal Medicine

## 2021-10-18 ENCOUNTER — Ambulatory Visit (INDEPENDENT_AMBULATORY_CARE_PROVIDER_SITE_OTHER): Payer: Medicare HMO

## 2021-10-18 VITALS — BP 128/70 | HR 67 | Temp 98.3°F

## 2021-10-18 DIAGNOSIS — Z23 Encounter for immunization: Secondary | ICD-10-CM | POA: Diagnosis not present

## 2021-10-18 NOTE — Progress Notes (Signed)
Patient presents today for pneumonia vaccine.

## 2021-10-18 NOTE — Patient Instructions (Signed)
Pneumococcal Conjugate Vaccine (Prevnar 13) Suspension for Injection What is this medication? PNEUMOCOCCAL VACCINE (NEU mo KOK al vak SEEN) is a vaccine used to prevent pneumococcus bacterial infections. These bacteria can cause serious infections like pneumonia, meningitis, and blood infections. This vaccine will lower your chance of getting pneumonia. If you do get pneumonia, it can make your symptoms milder and your illness shorter. This vaccine will not treat an infection and will not cause infection. This vaccine is recommended for infants and young children, adults with certain medical conditions, and adults 6 years or older. This medicine may be used for other purposes; ask your health care provider or pharmacist if you have questions. This medicine may be used for other purposes; ask your health care provider or pharmacist if you have questions. COMMON BRAND NAME(S): Prevnar, Prevnar 13 What should I tell my care team before I take this medication? They need to know if you have any of these conditions: bleeding problems fever immune system problems an unusual or allergic reaction to pneumococcal vaccine, diphtheria toxoid, other vaccines, latex, other medicines, foods, dyes, or preservatives pregnant or trying to get pregnant breast-feeding How should I use this medication? This vaccine is for injection into a muscle. It is given by a health care professional. A copy of Vaccine Information Statements will be given before each vaccination. Read this sheet carefully each time. The sheet may change frequently. Talk to your pediatrician regarding the use of this medicine in children. While this drug may be prescribed for children as young as 70 weeks old for selected conditions, precautions do apply. Overdosage: If you think you have taken too much of this medicine contact a poison control center or emergency room at once. NOTE: This medicine is only for you. Do not share this medicine with  others. Overdosage: If you think you have taken too much of this medicine contact a poison control center or emergency room at once. NOTE: This medicine is only for you. Do not share this medicine with others. What if I miss a dose? It is important not to miss your dose. Call your doctor or health care professional if you are unable to keep an appointment. What may interact with this medication? medicines for cancer chemotherapy medicines that suppress your immune function steroid medicines like prednisone or cortisone This list may not describe all possible interactions. Give your health care provider a list of all the medicines, herbs, non-prescription drugs, or dietary supplements you use. Also tell them if you smoke, drink alcohol, or use illegal drugs. Some items may interact with your medicine. This list may not describe all possible interactions. Give your health care provider a list of all the medicines, herbs, non-prescription drugs, or dietary supplements you use. Also tell them if you smoke, drink alcohol, or use illegal drugs. Some items may interact with your medicine. What should I watch for while using this medication? Mild fever and pain should go away in 3 days or less. Report any unusual symptoms to your doctor or health care professional. What side effects may I notice from receiving this medication? Side effects that you should report to your doctor or health care professional as soon as possible: allergic reactions like skin rash, itching or hives, swelling of the face, lips, or tongue breathing problems confused fast or irregular heartbeat fever over 102 degrees F seizures unusual bleeding or bruising unusual muscle weakness Side effects that usually do not require medical attention (report to your doctor or health care professional  if they continue or are bothersome): aches and pains diarrhea fever of 102 degrees F or less headache irritable loss of appetite pain,  tender at site where injected trouble sleeping This list may not describe all possible side effects. Call your doctor for medical advice about side effects. You may report side effects to FDA at 1-800-FDA-1088. This list may not describe all possible side effects. Call your doctor for medical advice about side effects. You may report side effects to FDA at 1-800-FDA-1088. Where should I keep my medication? This does not apply. This vaccine is given in a clinic, pharmacy, doctor's office, or other health care setting and will not be stored at home. NOTE: This sheet is a summary. It may not cover all possible information. If you have questions about this medicine, talk to your doctor, pharmacist, or health care provider.  2023 Elsevier/Gold Standard (2006-01-25 00:00:00)

## 2022-01-16 ENCOUNTER — Ambulatory Visit (INDEPENDENT_AMBULATORY_CARE_PROVIDER_SITE_OTHER): Payer: Medicare HMO | Admitting: Nurse Practitioner

## 2022-01-16 ENCOUNTER — Ambulatory Visit: Payer: Medicare HMO | Admitting: Nurse Practitioner

## 2022-01-16 VITALS — BP 126/70 | HR 67 | Temp 98.3°F | Ht 59.2 in | Wt 167.0 lb

## 2022-01-16 DIAGNOSIS — N1831 Chronic kidney disease, stage 3a: Secondary | ICD-10-CM

## 2022-01-16 DIAGNOSIS — R0981 Nasal congestion: Secondary | ICD-10-CM

## 2022-01-16 DIAGNOSIS — I129 Hypertensive chronic kidney disease with stage 1 through stage 4 chronic kidney disease, or unspecified chronic kidney disease: Secondary | ICD-10-CM

## 2022-01-16 DIAGNOSIS — E6609 Other obesity due to excess calories: Secondary | ICD-10-CM

## 2022-01-16 DIAGNOSIS — Z6833 Body mass index (BMI) 33.0-33.9, adult: Secondary | ICD-10-CM

## 2022-01-16 DIAGNOSIS — R7309 Other abnormal glucose: Secondary | ICD-10-CM

## 2022-01-16 NOTE — Progress Notes (Signed)
I,Tianna Badgett,acting as a Education administrator for [employer], FNP.,have documented all relevant documentation on the behalf of Minette Brine, FNP,as directed by  Minette Brine, FNP while in the presence of Minette Brine, [place].  Subjective:     Patient ID: Alyssa Lee , female    DOB: 1950-11-14 , 71 y.o.   MRN: [id number]   Chief Complaint  Patient presents with   Hypertension    HPI  Here for congestion after Thanksgiving. She has taken medications and she feels like this occurs every year. She had some norel tabs and used her inhaler (flonase). She does feel better. No eye crusting in am. She feels like she is better in this.   Hypertension This is a chronic problem. The current episode started more than 1 year ago. The problem is controlled. Pertinent negatives include no anxiety. Risk factors for coronary artery disease include obesity.     Past Medical History:  Diagnosis Date   Hypertension      Family History  Problem Relation Age of Onset   Hyperthyroidism Mother    Hypertension Mother    Diabetes Mother    Hypertension Father    COPD Father    Kidney failure Father      Current Outpatient Medications:    amLODipine (NORVASC) 10 MG tablet, Take 1 tablet (10 mg total) by mouth daily., Disp: 90 tablet, Rfl: 3   Cholecalciferol (VITAMIN D3) 5000 units CAPS, Take by mouth., Disp: , Rfl:    Elastic Bandages & Supports (THUMB SPLINT/RIGHT SMALL) MISC, 1 Units by Does not apply route daily., Disp: 1 each, Rfl: 0   fluticasone (FLONASE) 50 MCG/ACT nasal spray, Place 2 sprays into both nostrils daily., Disp: 48 g, Rfl: 1   lisinopril-hydrochlorothiazide (ZESTORETIC) 20-25 MG tablet, Take 1 tablet by mouth daily., Disp: 90 tablet, Rfl: 3   NOREL AD 4-10-325 MG TABS, Take 1 tablet by mouth 2 (two) times daily as needed., Disp: 30 tablet, Rfl: 0   OVER THE COUNTER MEDICATION, Probiotic with cranberry  1 sometimes, Disp: , Rfl:    vitamin C (ASCORBIC ACID) 500 MG tablet, Take 500 mg by  mouth daily., Disp: , Rfl:    No Known Allergies   Review of Systems  Constitutional: Negative.  Negative for chills and fatigue.  HENT:  Positive for congestion.   Eyes:        Eyes feel like "rocks" in them   Respiratory:  Positive for cough. Negative for wheezing.   Cardiovascular: Negative.   Gastrointestinal:  Positive for abdominal pain (abdomen cramps) and constipation.  Neurological: Negative.   Psychiatric/Behavioral: Negative.       Today's Vitals   01/16/22 1506  BP: 126/70  Pulse: 67  Temp: 98.3 F (36.8 C)  TempSrc: Oral  Weight: 167 lb (75.8 kg)  Height: 4' 11.2" (1.504 m)   Body mass index is 33.5 kg/m.  Wt Readings from Last 3 Encounters:  01/16/22 167 lb (75.8 kg)  06/14/21 161 lb (73 kg)  05/11/21 161 lb (73 kg)    Objective:  Physical Exam Vitals reviewed.  Constitutional:      General: She is not in acute distress.    Appearance: Normal appearance.  HENT:     Nose: No congestion.     Right Sinus: Frontal sinus tenderness present. No maxillary sinus tenderness.     Left Sinus: Frontal sinus tenderness present. No maxillary sinus tenderness.  Cardiovascular:     Rate and Rhythm: Normal rate and regular rhythm.  Pulses: Normal pulses.     Heart sounds: Normal heart sounds. No murmur heard. Pulmonary:     Effort: Pulmonary effort is normal. No respiratory distress.     Breath sounds: Normal breath sounds. No wheezing.  Skin:    Capillary Refill: Capillary refill takes less than 2 seconds.  Neurological:     General: No focal deficit present.     Mental Status: She is alert and oriented to person, place, and time.     Cranial Nerves: No cranial nerve deficit.     Motor: No weakness.  Psychiatric:        Mood and Affect: Mood normal.        Behavior: Behavior normal.        Thought Content: Thought content normal.        Judgment: Judgment normal.         Assessment And Plan:     1. Hypertensive nephropathy Comments: Blood  pressure is well controlled. Continue current medications - BMP8+EGFR - Lipid panel  2. Stage 3a chronic kidney disease ([place])  3. Other abnormal glucose Comments: Stable. Continue diet control. - Hemoglobin A1c  4. Sinus congestion Comments: She feels this is improving and at this time does not want medications. She does have frontal sinus tenderness. Continue Norel.  5. Class 1 obesity due to excess calories with serious comorbidity and body mass index (BMI) of 33.0 to 33.9 in adult She is encouraged to strive for BMI less than 30 to decrease cardiac risk. Advised to aim for at least 150 minutes of exercise per week.    Patient was given opportunity to ask questions. Patient verbalized understanding of the plan and was able to repeat key elements of the plan. All questions were answered to their satisfaction.  Minette Brine, FNP   I, Minette Brine, FNP, have reviewed all documentation for this visit. The documentation on 01/16/22 for the exam, diagnosis, procedures, and orders are all accurate and complete.   IF YOU HAVE BEEN REFERRED TO A SPECIALIST, IT MAY TAKE 1-2 WEEKS TO SCHEDULE/PROCESS THE REFERRAL. IF YOU HAVE NOT HEARD FROM US/SPECIALIST IN TWO WEEKS, PLEASE GIVE [place] A CALL AT [phone] X [phone].   THE PATIENT IS ENCOURAGED TO PRACTICE SOCIAL DISTANCING DUE TO THE COVID-19 PANDEMIC.

## 2022-01-16 NOTE — Patient Instructions (Signed)
Hypertension, Adult High blood pressure (hypertension) is when the force of blood pumping through the arteries is too strong. The arteries are the blood vessels that carry blood from the heart throughout the body. Hypertension forces the heart to work harder to pump blood and may cause arteries to become narrow or stiff. Untreated or uncontrolled hypertension can lead to a heart attack, heart failure, a stroke, kidney disease, and other problems. A blood pressure reading consists of a higher number over a lower number. Ideally, your blood pressure should be below 120/80. The first ("top") number is called the systolic pressure. It is a measure of the pressure in your arteries as your heart beats. The second ("bottom") number is called the diastolic pressure. It is a measure of the pressure in your arteries as the heart relaxes. What are the causes? The exact cause of this condition is not known. There are some conditions that result in high blood pressure. What increases the risk? Certain factors may make you more likely to develop high blood pressure. Some of these risk factors are under your control, including: Smoking. Not getting enough exercise or physical activity. Being overweight. Having too much fat, sugar, calories, or salt (sodium) in your diet. Drinking too much alcohol. Other risk factors include: Having a personal history of heart disease, diabetes, high cholesterol, or kidney disease. Stress. Having a family history of high blood pressure and high cholesterol. Having obstructive sleep apnea. Age. The risk increases with age. What are the signs or symptoms? High blood pressure may not cause symptoms. Very high blood pressure (hypertensive crisis) may cause: Headache. Fast or irregular heartbeats (palpitations). Shortness of breath. Nosebleed. Nausea and vomiting. Vision changes. Severe chest pain, dizziness, and seizures. How is this diagnosed? This condition is diagnosed by  measuring your blood pressure while you are seated, with your arm resting on a flat surface, your legs uncrossed, and your feet flat on the floor. The cuff of the blood pressure monitor will be placed directly against the skin of your upper arm at the level of your heart. Blood pressure should be measured at least twice using the same arm. Certain conditions can cause a difference in blood pressure between your right and left arms. If you have a high blood pressure reading during one visit or you have normal blood pressure with other risk factors, you may be asked to: Return on a different day to have your blood pressure checked again. Monitor your blood pressure at home for 1 week or longer. If you are diagnosed with hypertension, you may have other blood or imaging tests to help your health care provider understand your overall risk for other conditions. How is this treated? This condition is treated by making healthy lifestyle changes, such as eating healthy foods, exercising more, and reducing your alcohol intake. You may be referred for counseling on a healthy diet and physical activity. Your health care provider may prescribe medicine if lifestyle changes are not enough to get your blood pressure under control and if: Your systolic blood pressure is above 130. Your diastolic blood pressure is above 80. Your personal target blood pressure may vary depending on your medical conditions, your age, and other factors. Follow these instructions at home: Eating and drinking  Eat a diet that is high in fiber and potassium, and low in sodium, added sugar, and fat. An example of this eating plan is called the DASH diet. DASH stands for Dietary Approaches to Stop Hypertension. To eat this way: Eat   plenty of fresh fruits and vegetables. Try to fill one half of your plate at each meal with fruits and vegetables. Eat whole grains, such as whole-wheat pasta, brown rice, or whole-grain bread. Fill about one  fourth of your plate with whole grains. Eat or drink low-fat dairy products, such as skim milk or low-fat yogurt. Avoid fatty cuts of meat, processed or cured meats, and poultry with skin. Fill about one fourth of your plate with lean proteins, such as fish, chicken without skin, beans, eggs, or tofu. Avoid pre-made and processed foods. These tend to be higher in sodium, added sugar, and fat. Reduce your daily sodium intake. Many people with hypertension should eat less than 1,500 mg of sodium a day. Do not drink alcohol if: Your health care provider tells you not to drink. You are pregnant, may be pregnant, or are planning to become pregnant. If you drink alcohol: Limit how much you have to: 0-1 drink a day for women. 0-2 drinks a day for men. Know how much alcohol is in your drink. In the [place] one drink equals one 12 oz bottle of beer (355 mL), one 5 oz glass of wine (148 mL), or one 1 oz glass of hard liquor (44 mL). Lifestyle  Work with your health care provider to maintain a healthy body weight or to lose weight. Ask what an ideal weight is for you. Get at least 30 minutes of exercise that causes your heart to beat faster (aerobic exercise) most days of the week. Activities may include walking, swimming, or biking. Include exercise to strengthen your muscles (resistance exercise), such as Pilates or lifting weights, as part of your weekly exercise routine. Try to do these types of exercises for 30 minutes at least 3 days a week. Do not use any products that contain nicotine or tobacco. These products include cigarettes, chewing tobacco, and vaping devices, such as e-cigarettes. If you need help quitting, ask your health care provider. Monitor your blood pressure at home as told by your health care provider. Keep all follow-up visits. This is important. Medicines Take over-the-counter and prescription medicines only as told by your health care provider. Follow directions carefully. Blood  pressure medicines must be taken as prescribed. Do not skip doses of blood pressure medicine. Doing this puts you at risk for problems and can make the medicine less effective. Ask your health care provider about side effects or reactions to medicines that you should watch for. Contact a health care provider if you: Think you are having a reaction to a medicine you are taking. Have headaches that keep coming back (recurring). Feel dizzy. Have swelling in your ankles. Have trouble with your vision. Get help right away if you: Develop a severe headache or confusion. Have unusual weakness or numbness. Feel faint. Have severe pain in your chest or abdomen. Vomit repeatedly. Have trouble breathing. These symptoms may be an emergency. Get help right away. Call 911. Do not wait to see if the symptoms will go away. Do not drive yourself to the hospital. Summary Hypertension is when the force of blood pumping through your arteries is too strong. If this condition is not controlled, it may put you at risk for serious complications. Your personal target blood pressure may vary depending on your medical conditions, your age, and other factors. For most people, a normal blood pressure is less than 120/80. Hypertension is treated with lifestyle changes, medicines, or a combination of both. Lifestyle changes include losing weight, eating a healthy,   low-sodium diet, exercising more, and limiting alcohol. This information is not intended to replace advice given to you by your health care provider. Make sure you discuss any questions you have with your health care provider. Document Revised: 12/07/2020 Document Reviewed: 12/07/2020 Elsevier Patient Education  2023 [employer] Inc[employer]  

## 2022-01-17 ENCOUNTER — Ambulatory Visit: Payer: Medicare HMO | Admitting: Nurse Practitioner

## 2022-01-17 LAB — HEMOGLOBIN A1C
Est. average glucose Bld gHb Est-mCnc: 131 mg/dL
Hgb A1c MFr Bld: 6.2 % — ABNORMAL HIGH (ref 4.8–5.6)

## 2022-01-17 LAB — BMP8+EGFR
BUN/Creatinine Ratio: 15 (ref 12–28)
BUN: 15 mg/dL (ref 8–27)
CO2: 25 mmol/L (ref 20–29)
Calcium: 9.9 mg/dL (ref 8.7–10.3)
Chloride: 98 mmol/L (ref 96–106)
Creatinine, Ser: 1.01 mg/dL — ABNORMAL HIGH (ref 0.57–1.00)
Glucose: 138 mg/dL — ABNORMAL HIGH (ref 70–99)
Potassium: 4.7 mmol/L (ref 3.5–5.2)
Sodium: 138 mmol/L (ref 134–144)
eGFR: 60 mL/min/{1.73_m2} (ref >59)

## 2022-01-17 LAB — LIPID PANEL
Chol/HDL Ratio: 3.2 ratio (ref 0.0–4.4)
Cholesterol, Total: 182 mg/dL (ref 100–199)
HDL: 57 mg/dL (ref >39)
LDL Chol Calc (NIH): 102 mg/dL — ABNORMAL HIGH (ref 0–99)
Triglycerides: 131 mg/dL (ref 0–149)
VLDL Cholesterol Cal: 23 mg/dL (ref 5–40)

## 2022-02-01 ENCOUNTER — Encounter: Payer: Self-pay | Admitting: Nurse Practitioner

## 2022-02-09 ENCOUNTER — Other Ambulatory Visit: Payer: Self-pay | Admitting: Nurse Practitioner

## 2022-05-17 ENCOUNTER — Ambulatory Visit: Payer: Medicare HMO

## 2022-05-17 VITALS — BP 118/60 | HR 67 | Temp 98.1°F | Ht 59.0 in | Wt 164.4 lb

## 2022-05-17 DIAGNOSIS — Z0000 Encounter for general adult medical examination without abnormal findings: Secondary | ICD-10-CM

## 2022-05-17 NOTE — Progress Notes (Signed)
Subjective:   Alyssa Lee is a 72 y.o. female who presents for Medicare Annual (Subsequent) preventive examination.  Review of Systems     Cardiac Risk Factors include: advanced age (>8men, >73 women);hypertension;obesity (BMI >30kg/m2)     Objective:    Today's Vitals   05/17/22 1407  BP: 118/60  Pulse: 67  Temp: 98.1 F (36.7 C)  TempSrc: Oral  SpO2: 96%  Weight: 164 lb 6.4 oz (74.6 kg)  Height: 4\' 11"  (1.499 m)   Body mass index is 33.2 kg/m.     05/17/2022    2:16 PM 05/11/2021    3:22 PM 01/27/2021    3:32 PM 12/24/2019    3:54 PM 12/18/2018   10:53 AM 12/12/2017   12:50 PM  Advanced Directives  Does Patient Have a Medical Advance Directive? No No No No No No  Would patient like information on creating a medical advance directive?  No - Patient declined    Yes (MAU/Ambulatory/Procedural Areas - Information given)    Current Medications (verified) Outpatient Encounter Medications as of 05/17/2022  Medication Sig   amLODipine (NORVASC) 10 MG tablet Take 1 tablet (10 mg total) by mouth daily.   Cholecalciferol (VITAMIN D3) 5000 units CAPS Take by mouth.   fluticasone (FLONASE) 50 MCG/ACT nasal spray USE 2 SPRAYS IN EACH NOSTRIL DAILY   lisinopril-hydrochlorothiazide (ZESTORETIC) 20-25 MG tablet Take 1 tablet by mouth daily.   NOREL AD 4-10-325 MG TABS Take 1 tablet by mouth 2 (two) times daily as needed.   OVER THE COUNTER MEDICATION Probiotic with cranberry  1 sometimes   vitamin C (ASCORBIC ACID) 500 MG tablet Take 500 mg by mouth daily.   Elastic Bandages & Supports (THUMB SPLINT/RIGHT SMALL) MISC 1 Units by Does not apply route daily.   No facility-administered encounter medications on file as of 05/17/2022.    Allergies (verified) Patient has no known allergies.   History: Past Medical History:  Diagnosis Date   Hypertension    Past Surgical History:  Procedure Laterality Date   CESAREAN SECTION     PARTIAL HYSTERECTOMY  2009   TONSILLECTOMY   2004   Family History  Problem Relation Age of Onset   Hyperthyroidism Mother    Hypertension Mother    Diabetes Mother    Hypertension Father    COPD Father    Kidney failure Father    Social History   Socioeconomic History   Marital status: Married    Spouse name: Not on file   Number of children: Not on file   Years of education: Not on file   Highest education level: Not on file  Occupational History   Occupation: retired  Tobacco Use   Smoking status: Never   Smokeless tobacco: Never  Vaping Use   Vaping Use: Never used  Substance and Sexual Activity   Alcohol use: Never   Drug use: Never   Sexual activity: Not Currently  Other Topics Concern   Not on file  Social History Narrative   Not on file   Social Determinants of Health   Financial Resource Strain: Low Risk  (05/17/2022)   Overall Financial Resource Strain (CARDIA)    Difficulty of Paying Living Expenses: Not hard at all  Food Insecurity: No Food Insecurity (05/17/2022)   Hunger Vital Sign    Worried About Running Out of Food in the Last Year: Never true    Ran Out of Food in the Last Year: Never true  Transportation Needs: No Transportation Needs (  05/17/2022)   PRAPARE - Hydrologist (Medical): No    Lack of Transportation (Non-Medical): No  Physical Activity: Inactive (05/17/2022)   Exercise Vital Sign    Days of Exercise per Week: 0 days    Minutes of Exercise per Session: 0 min  Stress: No Stress Concern Present (05/17/2022)   [place]    Feeling of Stress : Not at all  Social Connections: Not on file    Tobacco Counseling Counseling given: Not Answered   Clinical Intake:  Pre-visit preparation completed: Yes  Pain : No/denies pain     Nutritional Status: BMI > 30  Obese Nutritional Risks: None Diabetes: No  How often do you need to have someone help you when you read instructions, pamphlets,  or other written materials from your doctor or pharmacy?: 1 - Never  Diabetic? no  Interpreter Needed?: No  Information entered by :: NAllen LPN   Activities of Daily Living    05/17/2022    2:17 PM  In your present state of health, do you have any difficulty performing the following activities:  Hearing? 0  Vision? 0  Difficulty concentrating or making decisions? 0  Walking or climbing stairs? 0  Dressing or bathing? 0  Doing errands, shopping? 0  Preparing Food and eating ? N  Using the Toilet? N  In the past six months, have you accidently leaked urine? N  Do you have problems with loss of bowel control? N  Managing your Medications? N  Managing your Finances? N  Housekeeping or managing your Housekeeping? N    Patient Care Team: Glendale Chard, MD as PCP - General (Internal Medicine)  Indicate any recent Medical Services you may have received from other than [facility] providers in the past year (date may be approximate).     Assessment:   This is a routine wellness examination for Alyssa Lee.  Hearing/Vision screen Vision Screening - Comments:: Regular eye exams, [facility]  Dietary issues and exercise activities discussed: Current Exercise Habits: The patient does not participate in regular exercise at present   Goals Addressed             This Visit's Progress    Patient Stated       05/17/2022, working on decreasing cholesterol       Depression Screen    05/17/2022    2:17 PM 01/16/2022    3:06 PM 05/11/2021    3:23 PM 01/27/2021    3:33 PM 12/24/2019    3:56 PM 12/24/2019    3:00 PM 12/18/2018   10:54 AM  PHQ 2/9 Scores  PHQ - 2 Score 0 0 0 0 0 0 0  PHQ- 9 Score       0    Fall Risk    05/17/2022    2:17 PM 01/16/2022    3:06 PM 05/11/2021    3:23 PM 01/27/2021    3:33 PM 12/24/2019    3:55 PM  [place] in the past year? 0 0 0 0 0  Number falls in past yr: 0 0     Injury with Fall? 0 0     Risk for fall due to : Medication side effect No  Fall Risks Medication side effect Medication side effect Medication side effect  Follow up Falls prevention discussed;Education provided;Falls evaluation completed Falls evaluation completed Falls evaluation completed;Education provided;Falls prevention discussed Falls evaluation completed;Education provided;Falls prevention discussed Falls  evaluation completed;Falls prevention discussed;Education provided    FALL RISK PREVENTION PERTAINING TO THE HOME:  Any stairs in or around the home? Yes  If so, are there any without handrails? No  Home free of loose throw rugs in walkways, pet beds, electrical cords, etc? Yes  Adequate lighting in your home to reduce risk of falls? Yes   ASSISTIVE DEVICES UTILIZED TO PREVENT FALLS:  Life alert? No  Use of a cane, walker or w/c? No  Grab bars in the bathroom? No  Shower chair or bench in shower? Yes  Elevated toilet seat or a handicapped toilet? Yes   TIMED UP AND GO:  Was the test performed? Yes .  Length of time to ambulate 10 feet: 5 sec.   Gait steady and fast without use of assistive device  Cognitive Function:        05/17/2022    2:18 PM 05/11/2021    3:25 PM 01/27/2021    3:35 PM 12/24/2019    3:56 PM 12/18/2018   10:56 AM  6CIT Screen  What Year? 0 points 0 points 0 points 0 points 0 points  What month? 0 points 0 points 0 points 0 points 0 points  What time? 0 points 0 points 0 points 0 points 0 points  Count back from 20 0 points 0 points 0 points 0 points 0 points  Months in reverse 0 points 0 points 0 points 0 points 0 points  Repeat phrase 0 points 0 points 0 points 0 points 0 points  Total Score 0 points 0 points 0 points 0 points 0 points    Immunizations Immunization History  Administered Date(s) Administered   Influenza, High Dose Seasonal PF 12/12/2017, 12/18/2018   Moderna SARS-COV2 Booster Vaccination 01/23/2020, 09/07/2020   Moderna Sars-Covid-2 Vaccination 03/30/2019, 04/27/2019   PNEUMOCOCCAL CONJUGATE-20  10/18/2021    TDAP status: Due, Education has been provided regarding the importance of this vaccine. Advised may receive this vaccine at local pharmacy or Health Dept. Aware to provide a copy of the vaccination record if obtained from local pharmacy or Health Dept. Verbalized acceptance and understanding.  Flu Vaccine status: Up to date  Pneumococcal vaccine status: Up to date  Covid-19 vaccine status: Completed vaccines  Qualifies for Shingles Vaccine? Yes   Zostavax completed No   Shingrix Completed?: No.    Education has been provided regarding the importance of this vaccine. Patient has been advised to call insurance company to determine out of pocket expense if they have not yet received this vaccine. Advised may also receive vaccine at local pharmacy or Health Dept. Verbalized acceptance and understanding.  Screening Tests Health Maintenance  Topic Date Due   DTaP/Tdap/Td (1 - Tdap) Never done   Zoster Vaccines- Shingrix (1 of 2) Never done   MAMMOGRAM  Never done   DEXA SCAN  Never done   Fecal DNA (Cologuard)  02/11/2020   COVID-19 Vaccine (3 - Moderna risk series) 10/05/2020   Medicare Annual Wellness (AWV)  05/12/2022   INFLUENZA VACCINE  09/14/2022   Pneumonia Vaccine 60+ Years old  Completed   Hepatitis C Screening  Completed   HPV VACCINES  Aged Out   COLONOSCOPY (Pts 45-56yrs Insurance coverage will need to be confirmed)  Discontinued    Health Maintenance  Health Maintenance Due  Topic Date Due   DTaP/Tdap/Td (1 - Tdap) Never done   Zoster Vaccines- Shingrix (1 of 2) Never done   MAMMOGRAM  Never done   DEXA SCAN  Never  done   Fecal DNA (Cologuard)  02/11/2020   COVID-19 Vaccine (3 - Moderna risk series) 10/05/2020   Medicare Annual Wellness (AWV)  05/12/2022    Colorectal cancer screening: scheduled for this year  Mammogram status: scheduled for 06/12/2022  Bone Density status: due  Lung Cancer Screening: (Low Dose CT Chest recommended if Age 89-80  years, 30 pack-year currently smoking OR have quit w/in 15years.) does not qualify.   Lung Cancer Screening Referral: no  Additional Screening:  Hepatitis C Screening: does qualify; Completed 12/18/2018  Vision Screening: Recommended annual ophthalmology exams for early detection of glaucoma and other disorders of the eye. Is the patient up to date with their annual eye exam?  Yes  Who is the provider or what is the name of the office in which the patient attends annual eye exams? [facility] If pt is not established with a provider, would they like to be referred to a provider to establish care? No .   Dental Screening: Recommended annual dental exams for proper oral hygiene  Community Resource Referral / Chronic Care Management: CRR required this visit?  No   CCM required this visit?  No      Plan:     I have personally reviewed and noted the following in the patient's chart:   Medical and social history Use of alcohol, tobacco or illicit drugs  Current medications and supplements including opioid prescriptions. Patient is not currently taking opioid prescriptions. Functional ability and status Nutritional status Physical activity Advanced directives List of other physicians Hospitalizations, surgeries, and ER visits in previous 12 months Vitals Screenings to include cognitive, depression, and falls Referrals and appointments  In addition, I have reviewed and discussed with patient certain preventive protocols, quality metrics, and best practice recommendations. A written personalized care plan for preventive services as well as general preventive health recommendations were provided to patient.     Kellie Simmering, LPN   [id number]   Nurse Notes: none

## 2022-05-17 NOTE — Patient Instructions (Signed)
Alyssa Lee , Thank you for taking time to come for your Medicare Wellness Visit. I appreciate your ongoing commitment to your health goals. Please review the following plan we discussed and let me know if I can assist you in the future.   These are the goals we discussed:  Goals       Exercise 150 min/wk Moderate Activity (pt-stated)      Would like to exercise regularly      Patient Stated      12/18/2018, would like to build muscle tone with bands and weights      Patient Stated      12/24/2019, wants to ,maintain      Patient Stated      01/27/2021, wants to join the gym      Patient Stated      05/11/2021, wants to go more often silver sneaker      Patient Stated      05/17/2022, working on decreasing cholesterol        This is a list of the screening recommended for you and due dates:  Health Maintenance  Topic Date Due   DTaP/Tdap/Td vaccine (1 - Tdap) Never done   Mammogram  Never done   DEXA scan (bone density measurement)  Never done   Cologuard (Stool DNA test)  02/11/2020   COVID-19 Vaccine (3 - Moderna risk series) 10/05/2020   Zoster (Shingles) Vaccine (1 of 2) 08/16/2022*   Flu Shot  09/14/2022   Medicare Annual Wellness Visit  05/17/2023   Pneumonia Vaccine  Completed   Hepatitis C Screening: USPSTF Recommendation to screen - Ages 18-79 yo.  Completed   HPV Vaccine  Aged Out   Colon Cancer Screening  Discontinued  *Topic was postponed. The date shown is not the original due date.    Advanced directives: Advance directive discussed with you today. Even though you declined this today please call our office should you change your mind and we can give you the proper paperwork for you to fill out.  Conditions/risks identified: none  Next appointment: Follow up in one year for your annual wellness visit    Preventive Care 65 Years and Older, Female Preventive care refers to lifestyle choices and visits with your health care provider that can promote health  and wellness. What does preventive care include? A yearly physical exam. This is also called an annual well check. Dental exams once or twice a year. Routine eye exams. Ask your health care provider how often you should have your eyes checked. Personal lifestyle choices, including: Daily care of your teeth and gums. Regular physical activity. Eating a healthy diet. Avoiding tobacco and drug use. Limiting alcohol use. Practicing safe sex. Taking low-dose aspirin every day. Taking vitamin and mineral supplements as recommended by your health care provider. What happens during an annual well check? The services and screenings done by your health care provider during your annual well check will depend on your age, overall health, lifestyle risk factors, and family history of disease. Counseling  Your health care provider may ask you questions about your: Alcohol use. Tobacco use. Drug use. Emotional well-being. Home and relationship well-being. Sexual activity. Eating habits. History of falls. Memory and ability to understand (cognition). Work and work Statistician. Reproductive health. Screening  You may have the following tests or measurements: Height, weight, and BMI. Blood pressure. Lipid and cholesterol levels. These may be checked every 5 years, or more frequently if you are over 73 years old. Skin  check. Lung cancer screening. You may have this screening every year starting at age 36 if you have a 30-pack-year history of smoking and currently smoke or have quit within the past 15 years. Fecal occult blood test (FOBT) of the stool. You may have this test every year starting at age 66. Flexible sigmoidoscopy or colonoscopy. You may have a sigmoidoscopy every 5 years or a colonoscopy every 10 years starting at age 76. Hepatitis C blood test. Hepatitis B blood test. Sexually transmitted disease (STD) testing. Diabetes screening. This is done by checking your blood sugar  (glucose) after you have not eaten for a while (fasting). You may have this done every 1-3 years. Bone density scan. This is done to screen for osteoporosis. You may have this done starting at age 73. Mammogram. This may be done every 1-2 years. Talk to your health care provider about how often you should have regular mammograms. Talk with your health care provider about your test results, treatment options, and if necessary, the need for more tests. Vaccines  Your health care provider may recommend certain vaccines, such as: Influenza vaccine. This is recommended every year. Tetanus, diphtheria, and acellular pertussis (Tdap, Td) vaccine. You may need a Td booster every 10 years. Zoster vaccine. You may need this after age 71. Pneumococcal 13-valent conjugate (PCV13) vaccine. One dose is recommended after age 37. Pneumococcal polysaccharide (PPSV23) vaccine. One dose is recommended after age 45. Talk to your health care provider about which screenings and vaccines you need and how often you need them. This information is not intended to replace advice given to you by your health care provider. Make sure you discuss any questions you have with your health care provider. Document Released: 02/26/2015 Document Revised: 10/20/2015 Document Reviewed: 12/01/2014 Elsevier Interactive Patient Education  2017 [place] Prevention in the Home Falls can cause injuries. They can happen to people of all ages. There are many things you can do to make your home safe and to help prevent falls. What can I do on the outside of my home? Regularly fix the edges of walkways and driveways and fix any cracks. Remove anything that might make you trip as you walk through a door, such as a raised step or threshold. Trim any bushes or trees on the path to your home. Use bright outdoor lighting. Clear any walking paths of anything that might make someone trip, such as rocks or tools. Regularly check to see  if handrails are loose or broken. Make sure that both sides of any steps have handrails. Any raised decks and porches should have guardrails on the edges. Have any leaves, snow, or ice cleared regularly. Use sand or salt on walking paths during winter. Clean up any spills in your garage right away. This includes oil or grease spills. What can I do in the bathroom? Use night lights. Install grab bars by the toilet and in the tub and shower. Do not use towel bars as grab bars. Use non-skid mats or decals in the tub or shower. If you need to sit down in the shower, use a plastic, non-slip stool. Keep the floor dry. Clean up any water that spills on the floor as soon as it happens. Remove soap buildup in the tub or shower regularly. Attach bath mats securely with double-sided non-slip rug tape. Do not have throw rugs and other things on the floor that can make you trip. What can I do in the bedroom? Use night lights. Make  sure that you have a light by your bed that is easy to reach. Do not use any sheets or blankets that are too big for your bed. They should not hang down onto the floor. Have a firm chair that has side arms. You can use this for support while you get dressed. Do not have throw rugs and other things on the floor that can make you trip. What can I do in the kitchen? Clean up any spills right away. Avoid walking on wet floors. Keep items that you use a lot in easy-to-reach places. If you need to reach something above you, use a strong step stool that has a grab bar. Keep electrical cords out of the way. Do not use floor polish or wax that makes floors slippery. If you must use wax, use non-skid floor wax. Do not have throw rugs and other things on the floor that can make you trip. What can I do with my stairs? Do not leave any items on the stairs. Make sure that there are handrails on both sides of the stairs and use them. Fix handrails that are broken or loose. Make sure that  handrails are as long as the stairways. Check any carpeting to make sure that it is firmly attached to the stairs. Fix any carpet that is loose or worn. Avoid having throw rugs at the top or bottom of the stairs. If you do have throw rugs, attach them to the floor with carpet tape. Make sure that you have a light switch at the top of the stairs and the bottom of the stairs. If you do not have them, ask someone to add them for you. What else can I do to help prevent falls? Wear shoes that: Do not have high heels. Have rubber bottoms. Are comfortable and fit you well. Are closed at the toe. Do not wear sandals. If you use a stepladder: Make sure that it is fully opened. Do not climb a closed stepladder. Make sure that both sides of the stepladder are locked into place. Ask someone to hold it for you, if possible. Clearly mark and make sure that you can see: Any grab bars or handrails. First and last steps. Where the edge of each step is. Use tools that help you move around (mobility aids) if they are needed. These include: Canes. Walkers. Scooters. Crutches. Turn on the lights when you go into a dark area. Replace any light bulbs as soon as they burn out. Set up your furniture so you have a clear path. Avoid moving your furniture around. If any of your floors are uneven, fix them. If there are any pets around you, be aware of where they are. Review your medicines with your doctor. Some medicines can make you feel dizzy. This can increase your chance of falling. Ask your doctor what other things that you can do to help prevent falls. This information is not intended to replace advice given to you by your health care provider. Make sure you discuss any questions you have with your health care provider. Document Released: 11/26/2008 Document Revised: 07/08/2015 Document Reviewed: 03/06/2014 Elsevier Interactive Patient Education  2017 [employer].

## 2022-06-12 ENCOUNTER — Ambulatory Visit
Admission: RE | Admit: 2022-06-12 | Discharge: 2022-06-12 | Disposition: A | Discharge status: Home / Self Care | Payer: Medicare HMO | Source: Ambulatory Visit | Attending: Nurse Practitioner | Admitting: Nurse Practitioner

## 2022-06-12 DIAGNOSIS — Z1231 Encounter for screening mammogram for malignant neoplasm of breast: Secondary | ICD-10-CM

## 2022-06-16 ENCOUNTER — Other Ambulatory Visit: Payer: Self-pay | Admitting: Nurse Practitioner

## 2022-06-16 DIAGNOSIS — R928 Other abnormal and inconclusive findings on diagnostic imaging of breast: Secondary | ICD-10-CM

## 2022-06-21 ENCOUNTER — Ambulatory Visit (INDEPENDENT_AMBULATORY_CARE_PROVIDER_SITE_OTHER): Payer: Medicare HMO | Admitting: Internal Medicine

## 2022-06-21 ENCOUNTER — Encounter: Payer: Self-pay | Admitting: Internal Medicine

## 2022-06-21 VITALS — BP 118/84 | HR 60 | Temp 98.2°F | Ht 59.0 in | Wt 161.2 lb

## 2022-06-21 DIAGNOSIS — N1831 Chronic kidney disease, stage 3a: Secondary | ICD-10-CM

## 2022-06-21 DIAGNOSIS — R0981 Nasal congestion: Secondary | ICD-10-CM | POA: Diagnosis not present

## 2022-06-21 DIAGNOSIS — E7800 Pure hypercholesterolemia, unspecified: Secondary | ICD-10-CM

## 2022-06-21 DIAGNOSIS — R7309 Other abnormal glucose: Secondary | ICD-10-CM

## 2022-06-21 DIAGNOSIS — Z6832 Body mass index (BMI) 32.0-32.9, adult: Secondary | ICD-10-CM

## 2022-06-21 DIAGNOSIS — R928 Other abnormal and inconclusive findings on diagnostic imaging of breast: Secondary | ICD-10-CM

## 2022-06-21 DIAGNOSIS — I129 Hypertensive chronic kidney disease with stage 1 through stage 4 chronic kidney disease, or unspecified chronic kidney disease: Secondary | ICD-10-CM | POA: Diagnosis not present

## 2022-06-21 DIAGNOSIS — Z0000 Encounter for general adult medical examination without abnormal findings: Secondary | ICD-10-CM | POA: Diagnosis not present

## 2022-06-21 DIAGNOSIS — E6609 Other obesity due to excess calories: Secondary | ICD-10-CM

## 2022-06-21 LAB — POCT URINALYSIS DIPSTICK
Bilirubin, UA: NEGATIVE
Blood, UA: NEGATIVE
Glucose, UA: NEGATIVE
Ketones, UA: NEGATIVE
Leukocytes, UA: NEGATIVE — AB
Nitrite, UA: NEGATIVE
Protein, UA: NEGATIVE
Spec Grav, UA: 1.025 (ref 1.010–1.025)
Urobilinogen, UA: 0.2 E.U./dL
pH, UA: 6.5 (ref 5.0–8.0)

## 2022-06-21 MED ORDER — NOREL AD 4-10-325 MG PO TABS
1.0000 | ORAL_TABLET | Freq: Two times a day (BID) | ORAL | 0 refills | Status: DC | PRN
Start: 2022-06-21 — End: 2023-12-13

## 2022-06-21 MED ORDER — AMLODIPINE BESYLATE 10 MG PO TABS
10.0000 mg | ORAL_TABLET | Freq: Every day | ORAL | 3 refills | Status: DC
Start: 2022-06-21 — End: 2022-08-30

## 2022-06-21 MED ORDER — LISINOPRIL-HYDROCHLOROTHIAZIDE 20-25 MG PO TABS
1.0000 | ORAL_TABLET | Freq: Every day | ORAL | 3 refills | Status: DC
Start: 2022-06-21 — End: 2022-08-30

## 2022-06-21 NOTE — Patient Instructions (Addendum)
The 10-year ASCVD risk score (Arnett DK, et al., 2019) is: 10.7%   Values used to calculate the score:     Age: 72 years     Sex: Female     Is Non-Hispanic African American: Yes     Diabetic: No     Tobacco smoker: No     Systolic Blood Pressure: 118 mmHg     Is BP treated: Yes     HDL Cholesterol: 57 mg/dL     Total Cholesterol: 182 mg/dL \  Health Maintenance, Female Adopting a healthy lifestyle and getting preventive care are important in promoting health and wellness. Ask your health care provider about: The right schedule for you to have regular tests and exams. Things you can do on your own to prevent diseases and keep yourself healthy. What should I know about diet, weight, and exercise? Eat a healthy diet  Eat a diet that includes plenty of vegetables, fruits, low-fat dairy products, and lean protein. Do not eat a lot of foods that are high in solid fats, added sugars, or sodium. Maintain a healthy weight Body mass index (BMI) is used to identify weight problems. It estimates body fat based on height and weight. Your health care provider can help determine your BMI and help you achieve or maintain a healthy weight. Get regular exercise Get regular exercise. This is one of the most important things you can do for your health. Most adults should: Exercise for at least 150 minutes each week. The exercise should increase your heart rate and make you sweat (moderate-intensity exercise). Do strengthening exercises at least twice a week. This is in addition to the moderate-intensity exercise. Spend less time sitting. Even light physical activity can be beneficial. Watch cholesterol and blood lipids Have your blood tested for lipids and cholesterol at 72 years of age, then have this test every 5 years. Have your cholesterol levels checked more often if: Your lipid or cholesterol levels are high. You are older than 72 years of age. You are at high risk for heart disease. What should  I know about cancer screening? Depending on your health history and family history, you may need to have cancer screening at various ages. This may include screening for: Breast cancer. Cervical cancer. Colorectal cancer. Skin cancer. Lung cancer. What should I know about heart disease, diabetes, and high blood pressure? Blood pressure and heart disease High blood pressure causes heart disease and increases the risk of stroke. This is more likely to develop in people who have high blood pressure readings or are overweight. Have your blood pressure checked: Every 3-5 years if you are 37-91 years of age. Every year if you are 64 years old or older. Diabetes Have regular diabetes screenings. This checks your fasting blood sugar level. Have the screening done: Once every three years after age 17 if you are at a normal weight and have a low risk for diabetes. More often and at a younger age if you are overweight or have a high risk for diabetes. What should I know about preventing infection? Hepatitis B If you have a higher risk for hepatitis B, you should be screened for this virus. Talk with your health care provider to find out if you are at risk for hepatitis B infection. Hepatitis C Testing is recommended for: Everyone born from [birth year] through 1965. Anyone with known risk factors for hepatitis C. Sexually transmitted infections (STIs) Get screened for STIs, including gonorrhea and chlamydia, if: You are sexually active  and are younger than 72 years of age. You are older than 72 years of age and your health care provider tells you that you are at risk for this type of infection. Your sexual activity has changed since you were last screened, and you are at increased risk for chlamydia or gonorrhea. Ask your health care provider if you are at risk. Ask your health care provider about whether you are at high risk for HIV. Your health care provider may recommend a prescription medicine to help  prevent HIV infection. If you choose to take medicine to prevent HIV, you should first get tested for HIV. You should then be tested every 3 months for as long as you are taking the medicine. Pregnancy If you are about to stop having your period (premenopausal) and you may become pregnant, seek counseling before you get pregnant. Take 400 to 800 micrograms (mcg) of folic acid every day if you become pregnant. Ask for birth control (contraception) if you want to prevent pregnancy. Osteoporosis and menopause Osteoporosis is a disease in which the bones lose minerals and strength with aging. This can result in bone fractures. If you are 48 years old or older, or if you are at risk for osteoporosis and fractures, ask your health care provider if you should: Be screened for bone loss. Take a calcium or vitamin D supplement to lower your risk of fractures. Be given hormone replacement therapy (HRT) to treat symptoms of menopause. Follow these instructions at home: Alcohol use Do not drink alcohol if: Your health care provider tells you not to drink. You are pregnant, may be pregnant, or are planning to become pregnant. If you drink alcohol: Limit how much you have to: 0-1 drink a day. Know how much alcohol is in your drink. In the [place] one drink equals one 12 oz bottle of beer (355 mL), one 5 oz glass of wine (148 mL), or one 1 oz glass of hard liquor (44 mL). Lifestyle Do not use any products that contain nicotine or tobacco. These products include cigarettes, chewing tobacco, and vaping devices, such as e-cigarettes. If you need help quitting, ask your health care provider. Do not use street drugs. Do not share needles. Ask your health care provider for help if you need support or information about quitting drugs. General instructions Schedule regular health, dental, and eye exams. Stay current with your vaccines. Tell your health care provider if: You often feel depressed. You have ever  been abused or do not feel safe at home. Summary Adopting a healthy lifestyle and getting preventive care are important in promoting health and wellness. Follow your health care provider's instructions about healthy diet, exercising, and getting tested or screened for diseases. Follow your health care provider's instructions on monitoring your cholesterol and blood pressure. This information is not intended to replace advice given to you by your health care provider. Make sure you discuss any questions you have with your health care provider. Document Revised: 06/21/2020 Document Reviewed: 06/21/2020 Elsevier Patient Education  [place].

## 2022-06-21 NOTE — Progress Notes (Signed)
I,Victoria T Hamilton,acting as a scribe for Gwynneth Aliment, MD.,have documented all relevant documentation on the behalf of Gwynneth Aliment, MD,as directed by  Gwynneth Aliment, MD while in the presence of Gwynneth Aliment, MD.   Subjective:     Patient ID: Alyssa Lee , female    DOB: [birth year] , 72 y.o.   MRN: [id number]   Chief Complaint  Patient presents with   Annual Exam   Hypertension    HPI  The patient is here today for a physical examination. She is no longer followed by GYN. She has no specific concerns or complaints at this time. She denies chest pain, palpitations and shortness of breath.   She reports having colonoscopy scheduled for June 11th.      Hypertension This is a chronic problem. The current episode started more than 1 year ago. The problem has been gradually improving since onset. The problem is controlled. Pertinent negatives include no blurred vision, chest pain, palpitations or shortness of breath. The current treatment provides moderate improvement. Hypertensive end-organ damage includes kidney disease.     Past Medical History:  Diagnosis Date   Hypertension      Family History  Problem Relation Age of Onset   Hyperthyroidism Mother    Hypertension Mother    Diabetes Mother    Hypertension Father    COPD Father    Kidney failure Father      Current Outpatient Medications:    Cholecalciferol (VITAMIN D3) 5000 units CAPS, Take by mouth., Disp: , Rfl:    Elastic Bandages & Supports (THUMB SPLINT/RIGHT SMALL) MISC, 1 Units by Does not apply route daily., Disp: 1 each, Rfl: 0   fluticasone (FLONASE) 50 MCG/ACT nasal spray, USE 2 SPRAYS IN EACH NOSTRIL DAILY, Disp: 48 g, Rfl: 3   OVER THE COUNTER MEDICATION, Probiotic with cranberry  1 sometimes, Disp: , Rfl:    vitamin C (ASCORBIC ACID) 500 MG tablet, Take 500 mg by mouth daily., Disp: , Rfl:    amLODipine (NORVASC) 10 MG tablet, Take 1 tablet (10 mg total) by mouth daily., Disp: 90 tablet,  Rfl: 3   lisinopril-hydrochlorothiazide (ZESTORETIC) 20-25 MG tablet, Take 1 tablet by mouth daily., Disp: 90 tablet, Rfl: 3   NOREL AD 4-10-325 MG TABS, Take 1 tablet by mouth 2 (two) times daily as needed., Disp: 30 tablet, Rfl: 0   No Known Allergies    The patient states she uses post menopausal status for birth control. Last LMP was No LMP recorded. Patient is premenopausal.. Negative for Dysmenorrhea. Negative for: breast discharge, breast lump(s), breast pain and breast self exam. Associated symptoms include abnormal vaginal bleeding. Pertinent negatives include abnormal bleeding (hematology), anxiety, decreased libido, depression, difficulty falling sleep, dyspareunia, history of infertility, nocturia, sexual dysfunction, sleep disturbances, urinary incontinence, urinary urgency, vaginal discharge and vaginal itching. Diet regular.The patient states her exercise level is moderate, she walks her dog regularly.   . The patient's tobacco use is:  Social History   Tobacco Use  Smoking Status Never  Smokeless Tobacco Never  . She has been exposed to passive smoke. The patient's alcohol use is:  Social History   Substance and Sexual Activity  Alcohol Use Never    Review of Systems  Constitutional: Negative.   HENT:  Positive for congestion.        Denies fever/chills. No ill contacts  Eyes: Negative.  Negative for blurred vision.  Respiratory: Negative.  Negative for shortness of breath.   Cardiovascular: Negative.  Negative for chest pain and palpitations.  Gastrointestinal: Negative.   Endocrine: Negative.   Genitourinary: Negative.   Musculoskeletal: Negative.   Skin: Negative.   Allergic/Immunologic: Negative.   Neurological: Negative.   Hematological: Negative.   Psychiatric/Behavioral: Negative.       Today's Vitals   06/21/22 1041  BP: 118/84  Pulse: 60  Temp: 98.2 F (36.8 C)  SpO2: 98%  Weight: 161 lb 3.2 oz (73.1 kg)  Height: 4\' 11"  (1.499 m)   Body mass  index is 32.56 kg/m.  Wt Readings from Last 3 Encounters:  06/21/22 161 lb 3.2 oz (73.1 kg)  05/17/22 164 lb 6.4 oz (74.6 kg)  01/16/22 167 lb (75.8 kg)    The 10-year ASCVD risk score (Arnett DK, et al., 2019) is: 10.7%   Values used to calculate the score:     Age: 55 years     Sex: Female     Is Non-Hispanic African American: Yes     Diabetic: No     Tobacco smoker: No     Systolic Blood Pressure: 118 mmHg     Is BP treated: Yes     HDL Cholesterol: 57 mg/dL     Total Cholesterol: 182 mg/dL  Objective:  Physical Exam Vitals and nursing note reviewed.  Constitutional:      Appearance: Normal appearance.  HENT:     Head: Normocephalic and atraumatic.     Right Ear: Tympanic membrane, ear canal and external ear normal.     Left Ear: Tympanic membrane, ear canal and external ear normal.     Nose: Nose normal.     Mouth/Throat:     Mouth: Mucous membranes are moist.     Pharynx: Oropharynx is clear.  Eyes:     Extraocular Movements: Extraocular movements intact.     Conjunctiva/sclera: Conjunctivae normal.     Pupils: Pupils are equal, round, and reactive to light.  Cardiovascular:     Rate and Rhythm: Normal rate and regular rhythm.     Pulses: Normal pulses.     Heart sounds: Normal heart sounds.  Pulmonary:     Effort: Pulmonary effort is normal.     Breath sounds: Normal breath sounds.  Chest:  Breasts:    Tanner Score is 5.     Right: Normal.     Left: Normal.  Abdominal:     General: Abdomen is flat. Bowel sounds are normal.     Palpations: Abdomen is soft.  Genitourinary:    Comments: deferred Musculoskeletal:        General: Normal range of motion.     Cervical back: Normal range of motion and neck supple.  Skin:    General: Skin is warm and dry.  Neurological:     General: No focal deficit present.     Mental Status: She is alert and oriented to person, place, and time.  Psychiatric:        Mood and Affect: Mood normal.        Behavior: Behavior  normal.         Assessment And Plan:     1. Encounter for annual physical exam Comments: A full exam was performed. Importance of monthly self breast exams was discussed with the patient.  PATIENT IS ADVISED TO GET 30-45 MINUTES REGULAR EXERCISE NO LESS THAN FOUR TO FIVE DAYS PER WEEK - BOTH WEIGHTBEARING EXERCISES AND AEROBIC ARE RECOMMENDED.  PATIENT IS ADVISED TO FOLLOW A HEALTHY DIET WITH AT LEAST SIX FRUITS/VEGGIES PER  DAY, DECREASE INTAKE OF RED MEAT, AND TO INCREASE FISH INTAKE TO TWO DAYS PER WEEK.  MEATS/FISH SHOULD NOT BE FRIED, BAKED OR BROILED IS PREFERABLE.  IT IS ALSO IMPORTANT TO CUT BACK ON YOUR SUGAR INTAKE. PLEASE AVOID ANYTHING WITH ADDED SUGAR, CORN SYRUP OR OTHER SWEETENERS. IF YOU MUST USE A SWEETENER, YOU CAN TRY STEVIA. IT IS ALSO IMPORTANT TO AVOID ARTIFICIALLY SWEETENERS AND DIET BEVERAGES. LASTLY, I SUGGEST WEARING SPF 50 SUNSCREEN ON EXPOSED PARTS AND ESPECIALLY WHEN IN THE DIRECT SUNLIGHT FOR AN EXTENDED PERIOD OF TIME.  PLEASE AVOID FAST FOOD RESTAURANTS AND INCREASE YOUR WATER INTAKE.  2. Hypertensive nephropathy Comments: Blood pressure is fairly controlled. Diastolic elevation noted. She will c/w lisinopril/hct 20/25 and amlodipine 10mg  qd. EKG: SB w/o acute changes. F/u 6 mos. - amLODipine (NORVASC) 10 MG tablet; Take 1 tablet (10 mg total) by mouth daily.  Dispense: 90 tablet; Refill: 3 - lisinopril-hydrochlorothiazide (ZESTORETIC) 20-25 MG tablet; Take 1 tablet by mouth daily.  Dispense: 90 tablet; Refill: 3 - POCT Urinalysis Dipstick (81002) - Microalbumin / creatinine urine ratio - EKG 12-Lead - CMP14+EGFR - CBC  3. Stage 3a chronic kidney disease (HCC) Comments: Chronic, reminded to avoid NSAIDs, keep BP well controlled and stay well hydrated to decrease risk of CKD progression. - PTH, intact and calcium - Phosphorus - Protein electrophoresis, serum  4. Sinus congestion Comments: LIkely due to allergies. She was given refill of Norel AD to take twice  daily prn. She will let me know if her sx persist. - NOREL AD 4-10-325 MG TABS; Take 1 tablet by mouth 2 (two) times daily as needed.  Dispense: 30 tablet; Refill: 0  5. Other abnormal glucose Comments: Previous labs reviewed, her a1c has been elevated in the past. I will recheck this today, reminded to limit sugary beverages, including diet drinks. - Hemoglobin A1c  6. Pure hypercholesterolemia Comments: Chronic, ASCVD risk is 10%. She agrees to cardiac calcium scoring. - CT CARDIAC SCORING (SELF PAY ONLY); Future  7. Abnormal mammogram of both breasts Comments: Recent mammogram results reviewed. She has upcoming diagnostic mammo and u/s scheduled, encouraged to keep the appt.  8. Class 1 obesity due to excess calories with serious comorbidity and body mass index (BMI) of 32.0 to 32.9 in adult Comments: She is encouraged to strive for BMI less than 30 to decrease cardiac risk. Advised to aim for at least 150 minutes of exercise per week.  Patient was given opportunity to ask questions. Patient verbalized understanding of the plan and was able to repeat key elements of the plan. All questions were answered to their satisfaction.   I, Gwynneth Aliment, MD, have reviewed all documentation for this visit. The documentation on 06/21/22 for the exam, diagnosis, procedures, and orders are all accurate and complete.   THE PATIENT IS ENCOURAGED TO PRACTICE SOCIAL DISTANCING DUE TO THE COVID-19 PANDEMIC.

## 2022-06-22 LAB — CMP14+EGFR
ALT: 16 IU/L (ref 0–32)
AST: 16 IU/L (ref 0–40)
Albumin/Globulin Ratio: 1.5 (ref 1.2–2.2)
Albumin: 4.5 g/dL (ref 3.8–4.8)
Alkaline Phosphatase: 52 IU/L (ref 44–121)
BUN/Creatinine Ratio: 18 (ref 12–28)
BUN: 17 mg/dL (ref 8–27)
Bilirubin Total: 0.3 mg/dL (ref 0.0–1.2)
CO2: 25 mmol/L (ref 20–29)
Calcium: 10.1 mg/dL (ref 8.7–10.3)
Chloride: 101 mmol/L (ref 96–106)
Creatinine, Ser: 0.97 mg/dL (ref 0.57–1.00)
Globulin, Total: 3.1 g/dL (ref 1.5–4.5)
Glucose: 86 mg/dL (ref 70–99)
Potassium: 4.3 mmol/L (ref 3.5–5.2)
Sodium: 141 mmol/L (ref 134–144)
Total Protein: 7.6 g/dL (ref 6.0–8.5)
eGFR: 62 mL/min/{1.73_m2} (ref >59)

## 2022-06-22 LAB — MICROALBUMIN / CREATININE URINE RATIO
Creatinine, Urine: 71.1 mg/dL
Microalb/Creat Ratio: <4 mg/g creat (ref 0–29)
Microalbumin, Urine: <3 ug/mL

## 2022-06-26 LAB — CBC
Hematocrit: 39.7 % (ref 34.0–46.6)
Hemoglobin: 13 g/dL (ref 11.1–15.9)
MCH: 28.9 pg (ref 26.6–33.0)
MCHC: 32.7 g/dL (ref 31.5–35.7)
MCV: 88 fL (ref 79–97)
Platelets: 305 10*3/uL (ref 150–450)
RBC: 4.5 x10E6/uL (ref 3.77–5.28)
RDW: 13.2 % (ref 11.7–15.4)
WBC: 4.3 10*3/uL (ref 3.4–10.8)

## 2022-06-26 LAB — PROTEIN ELECTROPHORESIS, SERUM
A/G Ratio: 0.9 (ref 0.7–1.7)
Albumin ELP: 3.7 g/dL (ref 2.9–4.4)
Alpha 1: 0.3 g/dL (ref 0.0–0.4)
Alpha 2: 0.8 g/dL (ref 0.4–1.0)
Beta: 1.2 g/dL (ref 0.7–1.3)
Gamma Globulin: 1.6 g/dL (ref 0.4–1.8)
Globulin, Total: 3.9 g/dL (ref 2.2–3.9)
Total Protein: 7.6 g/dL (ref 6.0–8.5)

## 2022-06-26 LAB — PTH, INTACT AND CALCIUM
Calcium: 10.4 mg/dL — ABNORMAL HIGH (ref 8.7–10.3)
PTH: 41 pg/mL (ref 15–65)

## 2022-06-26 LAB — HEMOGLOBIN A1C
Est. average glucose Bld gHb Est-mCnc: 134 mg/dL
Hgb A1c MFr Bld: 6.3 % — ABNORMAL HIGH (ref 4.8–5.6)

## 2022-06-26 LAB — PHOSPHORUS: Phosphorus: 3.6 mg/dL (ref 3.0–4.3)

## 2022-07-06 ENCOUNTER — Ambulatory Visit
Admission: RE | Admit: 2022-07-06 | Discharge: 2022-07-06 | Disposition: A | Discharge status: Home / Self Care | Payer: Medicare HMO | Source: Ambulatory Visit | Attending: Nurse Practitioner | Admitting: Nurse Practitioner

## 2022-07-06 DIAGNOSIS — R928 Other abnormal and inconclusive findings on diagnostic imaging of breast: Secondary | ICD-10-CM

## 2022-07-17 ENCOUNTER — Encounter: Payer: Self-pay | Admitting: Internal Medicine

## 2022-07-27 ENCOUNTER — Encounter: Payer: Self-pay | Admitting: Family Medicine

## 2022-07-27 ENCOUNTER — Ambulatory Visit (INDEPENDENT_AMBULATORY_CARE_PROVIDER_SITE_OTHER): Payer: Medicare HMO | Admitting: Family Medicine

## 2022-07-27 VITALS — BP 110/64 | HR 67 | Temp 98.4°F | Ht 59.0 in | Wt 162.0 lb

## 2022-07-27 DIAGNOSIS — J3489 Other specified disorders of nose and nasal sinuses: Secondary | ICD-10-CM

## 2022-07-27 DIAGNOSIS — J069 Acute upper respiratory infection, unspecified: Secondary | ICD-10-CM | POA: Diagnosis not present

## 2022-07-27 DIAGNOSIS — R059 Cough, unspecified: Secondary | ICD-10-CM

## 2022-07-27 MED ORDER — AMOXICILLIN-POT CLAVULANATE 875-125 MG PO TABS
1.0000 | ORAL_TABLET | Freq: Two times a day (BID) | ORAL | 0 refills | Status: AC
Start: 2022-07-27 — End: 2022-08-03

## 2022-07-27 MED ORDER — BENZONATATE 100 MG PO CAPS
100.0000 mg | ORAL_CAPSULE | Freq: Three times a day (TID) | ORAL | 0 refills | Status: DC | PRN
Start: 2022-07-27 — End: 2023-09-20

## 2022-07-27 NOTE — Progress Notes (Signed)
I,Camary Sosa,acting as a scribe for [employer], NP.,have documented all relevant documentation on the behalf of Chamya Hunton, NP,as directed by  Tangelia Sanson Moshe Salisbury, NP while in the presence of Joden Bonsall [facility], NP.  Subjective:  Patient ID: Alyssa Lee , female    DOB: 1951-01-06 , 72 y.o.   MRN: [id number]  Chief Complaint  Patient presents with   Cough    HPI  Patient presents today for a cold and chest congestion. Patient's husband is sick also and was treated in the office yesterday for an upper respiratory infection(URI), patient states that their 72 year old grandson came home sick almost 2 weeks ago and they caught it but he is up and about now. She reports she has been using her nasal spray and she states she has been using Norel AD and it hasn't helped much.  Cough This is a new problem. The current episode started in the past 7 days. The problem has been gradually worsening. The cough is Productive of blood-tinged sputum. Associated symptoms include nasal congestion, postnasal drip and rhinorrhea. Pertinent negatives include no chills or fever. Nothing aggravates the symptoms. The treatment provided no relief.     Past Medical History:  Diagnosis Date   Hypertension      Family History  Problem Relation Age of Onset   Hyperthyroidism Mother    Hypertension Mother    Diabetes Mother    Hypertension Father    COPD Father    Kidney failure Father      Current Outpatient Medications:    amLODipine (NORVASC) 10 MG tablet, Take 1 tablet (10 mg total) by mouth daily., Disp: 90 tablet, Rfl: 3   amoxicillin-clavulanate (AUGMENTIN) 875-125 MG tablet, Take 1 tablet by mouth 2 (two) times daily for 7 days., Disp: 14 tablet, Rfl: 0   benzonatate (TESSALON PERLES) 100 MG capsule, Take 1 capsule (100 mg total) by mouth 3 (three) times daily as needed for up to 10 days for cough., Disp: 30 capsule, Rfl: 0   Cholecalciferol (VITAMIN D3) 5000 units CAPS, Take by mouth., Disp: , Rfl:    Elastic  Bandages & Supports (THUMB SPLINT/RIGHT SMALL) MISC, 1 Units by Does not apply route daily., Disp: 1 each, Rfl: 0   fluticasone (FLONASE) 50 MCG/ACT nasal spray, USE 2 SPRAYS IN EACH NOSTRIL DAILY, Disp: 48 g, Rfl: 3   lisinopril-hydrochlorothiazide (ZESTORETIC) 20-25 MG tablet, Take 1 tablet by mouth daily., Disp: 90 tablet, Rfl: 3   NOREL AD 4-10-325 MG TABS, Take 1 tablet by mouth 2 (two) times daily as needed., Disp: 30 tablet, Rfl: 0   OVER THE COUNTER MEDICATION, Probiotic with cranberry  1 sometimes, Disp: , Rfl:    vitamin C (ASCORBIC ACID) 500 MG tablet, Take 500 mg by mouth daily., Disp: , Rfl:    No Known Allergies   Review of Systems  Constitutional:  Negative for chills and fever.  HENT:  Positive for postnasal drip, rhinorrhea, sinus pressure and sinus pain.   Respiratory:  Positive for cough.   Musculoskeletal: Negative.   Psychiatric/Behavioral: Negative.       Today's Vitals   07/27/22 1012  BP: 110/64  Pulse: 67  Temp: 98.4 F (36.9 C)  Weight: 162 lb (73.5 kg)  Height: 4\' 11"  (1.499 m)  PainSc: 0-No pain   Body mass index is 32.72 kg/m.  Wt Readings from Last 3 Encounters:  07/27/22 162 lb (73.5 kg)  06/21/22 161 lb 3.2 oz (73.1 kg)  05/17/22 164 lb 6.4 oz (74.6  kg)     Objective:  Physical Exam HENT:     Head: Normocephalic.     Nose: Congestion present.  Pulmonary:     Effort: Pulmonary effort is normal. No respiratory distress.     Breath sounds: Normal breath sounds. No rhonchi.  Chest:     Chest wall: No tenderness.  Skin:    General: Skin is warm and dry.  Neurological:     Mental Status: She is alert and oriented to person, place, and time.  Psychiatric:        Mood and Affect: Mood normal.        Behavior: Behavior normal.         Assessment And Plan:  URI with cough and congestion Assessment & Plan: Complete all antibioticts as prescribed, Take cough medications as needed.Drink plenty of fluids and rest  Orders: -      Amoxicillin-Pot Clavulanate; Take 1 tablet by mouth 2 (two) times daily for 7 days.  Dispense: 14 tablet; Refill: 0 -     Benzonatate; Take 1 capsule (100 mg total) by mouth 3 (three) times daily as needed for up to 10 days for cough.  Dispense: 30 capsule; Refill: 0  Sinus pain Assessment & Plan: May inhale steam, use warm compress or saline spray to rinse nose       Return if symptoms worsen or fail to improve, for keep previous appt.  Patient was given opportunity to ask questions. Patient verbalized understanding of the plan and was able to repeat key elements of the plan. All questions were answered to their satisfaction.  Jericca Russett Moshe Salisbury, NP  I, Lindell Tussey Moshe Salisbury, NP, have reviewed all documentation for this visit. The documentation on 07/31/22 for the exam, diagnosis, procedures, and orders are all accurate and complete.   IF YOU HAVE BEEN REFERRED TO A SPECIALIST, IT MAY TAKE 1-2 WEEKS TO SCHEDULE/PROCESS THE REFERRAL. IF YOU HAVE NOT HEARD FROM US/SPECIALIST IN TWO WEEKS, PLEASE GIVE [place] A CALL AT [phone].   THE PATIENT IS ENCOURAGED TO PRACTICE SOCIAL DISTANCING DUE TO THE COVID-19 PANDEMIC.

## 2022-07-31 DIAGNOSIS — J3489 Other specified disorders of nose and nasal sinuses: Secondary | ICD-10-CM | POA: Insufficient documentation

## 2022-07-31 DIAGNOSIS — J069 Acute upper respiratory infection, unspecified: Secondary | ICD-10-CM | POA: Insufficient documentation

## 2022-07-31 DIAGNOSIS — R059 Cough, unspecified: Secondary | ICD-10-CM | POA: Insufficient documentation

## 2022-07-31 NOTE — Assessment & Plan Note (Signed)
May inhale steam, use warm compress or saline spray to rinse nose

## 2022-07-31 NOTE — Assessment & Plan Note (Addendum)
Complete all antibioticts as prescribed, Take cough medications as needed.Drink plenty of fluids and rest

## 2022-08-30 ENCOUNTER — Other Ambulatory Visit: Payer: Self-pay | Admitting: Nurse Practitioner

## 2022-08-30 DIAGNOSIS — I129 Hypertensive chronic kidney disease with stage 1 through stage 4 chronic kidney disease, or unspecified chronic kidney disease: Secondary | ICD-10-CM

## 2022-10-10 LAB — AMB RESULTS CONSOLE CBG: Glucose: 81

## 2022-10-10 NOTE — Progress Notes (Signed)
Pt has PCP and no SDOH needs at this time. 

## 2022-10-31 NOTE — Progress Notes (Signed)
The patient attended 10/10/22 screening event where her screening results were wnl. At the event the patient noted she has insurance, a pcp and does not smoke.  Per chart review pt has a pcp and the last ov with pcp was 06/21/22. Chart review also indicates future appts with pcp 06/25/23 and 12/25/22. No additional Health equity team support indicated at this time.

## 2022-12-21 LAB — HM COLONOSCOPY

## 2022-12-25 ENCOUNTER — Ambulatory Visit (INDEPENDENT_AMBULATORY_CARE_PROVIDER_SITE_OTHER): Payer: Medicare HMO | Admitting: Internal Medicine

## 2022-12-25 VITALS — BP 110/60 | HR 60 | Temp 97.8°F | Ht 59.0 in | Wt 160.6 lb

## 2022-12-25 DIAGNOSIS — E2839 Other primary ovarian failure: Secondary | ICD-10-CM | POA: Insufficient documentation

## 2022-12-25 DIAGNOSIS — Z6832 Body mass index (BMI) 32.0-32.9, adult: Secondary | ICD-10-CM

## 2022-12-25 DIAGNOSIS — R7309 Other abnormal glucose: Secondary | ICD-10-CM | POA: Insufficient documentation

## 2022-12-25 DIAGNOSIS — R7303 Prediabetes: Secondary | ICD-10-CM | POA: Diagnosis not present

## 2022-12-25 DIAGNOSIS — M25562 Pain in left knee: Secondary | ICD-10-CM | POA: Diagnosis not present

## 2022-12-25 DIAGNOSIS — Z860100 Personal history of colon polyps, unspecified: Secondary | ICD-10-CM | POA: Insufficient documentation

## 2022-12-25 DIAGNOSIS — I10 Essential (primary) hypertension: Secondary | ICD-10-CM | POA: Diagnosis not present

## 2022-12-25 DIAGNOSIS — G8929 Other chronic pain: Secondary | ICD-10-CM | POA: Insufficient documentation

## 2022-12-25 DIAGNOSIS — E7800 Pure hypercholesterolemia, unspecified: Secondary | ICD-10-CM | POA: Diagnosis not present

## 2022-12-25 DIAGNOSIS — E6609 Other obesity due to excess calories: Secondary | ICD-10-CM | POA: Insufficient documentation

## 2022-12-25 DIAGNOSIS — Z2821 Immunization not carried out because of patient refusal: Secondary | ICD-10-CM | POA: Insufficient documentation

## 2022-12-25 DIAGNOSIS — E66811 Obesity, class 1: Secondary | ICD-10-CM | POA: Insufficient documentation

## 2022-12-25 NOTE — Assessment & Plan Note (Signed)
Previous labs reviewed, her A1c has been elevated in the past. I will check an A1c today. Reminded to avoid refined sugars including sugary drinks/foods and processed meats including bacon, sausages and deli meats.  

## 2022-12-25 NOTE — Assessment & Plan Note (Signed)
Chronic, well controlled. She will continue with amlodipine 10mg  daily and lisinopril/hct 20/25mg  daily. Encouraged to follow low sodium diet.

## 2022-12-25 NOTE — Assessment & Plan Note (Signed)
She also declined Shingrix and COVID vaccinations.

## 2022-12-25 NOTE — Assessment & Plan Note (Signed)
Colonoscopy results reviewed. She is encouraged to continue with a high fiber diet.

## 2022-12-25 NOTE — Assessment & Plan Note (Signed)
She is encouraged to strive for BMI less than 30 to decrease cardiac risk. Advised to aim for at least 150 minutes of exercise per week.

## 2022-12-25 NOTE — Assessment & Plan Note (Signed)
Chronic, cardiac calcium scoring ordered earlier this year. Scheduling did attempt the call the patient; however, unable to leave msg. Referral coordinator advised to give pt information to call to schedule this testing. She is encouraged to follow a heart healthy lifestyle.

## 2022-12-25 NOTE — Assessment & Plan Note (Signed)
Chronic, she agrees to Ortho evaluation. Advised radiographic studies are needed as part of her evaluation.

## 2022-12-25 NOTE — Progress Notes (Signed)
Alyssa Lee, CMA,acting as a scribe for Alyssa Aliment, MD.,have documented all relevant documentation on the behalf of Alyssa Aliment, MD,as directed by  Alyssa Aliment, MD while in the presence of Alyssa Aliment, MD.  Subjective:  Patient ID: Alyssa Lee , female    DOB: July 19, 1950 , 72 y.o.   MRN: [id number]  Chief Complaint  Patient presents with   Hypertension    HPI  Patient presents today for a bp and pre-dm follow up, Patient reports compliance with medication. Patient denies having any chest pain, SOB, or headaches. Patient has no concerns today. Patient reports she had a  colonoscopy last Thursday. She was advised she had one polyp and it was removed. She was advised no more colonoscopies are needed.  Hypertension This is a chronic problem. The current episode started more than 1 year ago. The problem has been gradually improving since onset. The problem is controlled. Pertinent negatives include no blurred vision, chest pain, palpitations or shortness of breath. Risk factors for coronary artery disease include dyslipidemia and post-menopausal state. Past treatments include diuretics and ACE inhibitors. The current treatment provides moderate improvement. Hypertensive end-organ damage includes kidney disease.     Past Medical History:  Diagnosis Date   Hypertension      Family History  Problem Relation Age of Onset   Hyperthyroidism Mother    Hypertension Mother    Diabetes Mother    Hypertension Father    COPD Father    Kidney failure Father      Current Outpatient Medications:    amLODipine (NORVASC) 10 MG tablet, TAKE 1 TABLET DAILY, Disp: 90 tablet, Rfl: 3   Cholecalciferol (VITAMIN D3) 5000 units CAPS, Take by mouth., Disp: , Rfl:    Elastic Bandages & Supports (THUMB SPLINT/RIGHT SMALL) MISC, 1 Units by Does not apply route daily., Disp: 1 each, Rfl: 0   fluticasone (FLONASE) 50 MCG/ACT nasal spray, USE 2 SPRAYS IN EACH NOSTRIL DAILY, Disp: 48 g, Rfl:  3   lisinopril-hydrochlorothiazide (ZESTORETIC) 20-25 MG tablet, TAKE 1 TABLET DAILY, Disp: 90 tablet, Rfl: 3   NOREL AD 4-10-325 MG TABS, Take 1 tablet by mouth 2 (two) times daily as needed., Disp: 30 tablet, Rfl: 0   OVER THE COUNTER MEDICATION, Probiotic with cranberry  1 sometimes, Disp: , Rfl:    vitamin C (ASCORBIC ACID) 500 MG tablet, Take 500 mg by mouth daily., Disp: , Rfl:    benzonatate (TESSALON PERLES) 100 MG capsule, Take 1 capsule (100 mg total) by mouth 3 (three) times daily as needed for up to 10 days for cough., Disp: 30 capsule, Rfl: 0   No Known Allergies   Review of Systems  Constitutional: Negative.   HENT: Negative.    Eyes: Negative.  Negative for blurred vision.  Respiratory: Negative.  Negative for shortness of breath.   Cardiovascular: Negative.  Negative for chest pain and palpitations.  Gastrointestinal: Negative.   Musculoskeletal:  Positive for arthralgias.       She c/o left knee pain. There is pain with ambulation. Denies fall/trauma.      Today's Vitals   12/25/22 1002  BP: 110/60  Pulse: 60  Temp: 97.8 F (36.6 C)  TempSrc: Oral  Weight: 160 lb 9.6 oz (72.8 kg)  Height: 4\' 11"  (1.499 m)  PainSc: 2   PainLoc: Knee   Body mass index is 32.44 kg/m.  Wt Readings from Last 3 Encounters:  12/25/22 160 lb 9.6 oz (72.8 kg)  07/27/22 162  lb (73.5 kg)  06/21/22 161 lb 3.2 oz (73.1 kg)    The 10-year ASCVD risk score (Arnett DK, et al., 2019) is: 9.5%   Values used to calculate the score:     Age: 26 years     Sex: Female     Is Non-Hispanic African American: Yes     Diabetic: No     Tobacco smoker: No     Systolic Blood Pressure: 110 mmHg     Is BP treated: Yes     HDL Cholesterol: 57 mg/dL     Total Cholesterol: 182 mg/dL  Objective:  Physical Exam Vitals and nursing note reviewed.  Constitutional:      Appearance: Normal appearance.  HENT:     Head: Normocephalic and atraumatic.  Eyes:     Extraocular Movements: Extraocular  movements intact.  Cardiovascular:     Rate and Rhythm: Normal rate and regular rhythm.     Heart sounds: Normal heart sounds.  Pulmonary:     Effort: Pulmonary effort is normal.     Breath sounds: Normal breath sounds.  Musculoskeletal:     Cervical back: Normal range of motion.     Comments: Crepitus left knee  Skin:    General: Skin is warm.  Neurological:     General: No focal deficit present.     Mental Status: She is alert.  Psychiatric:        Mood and Affect: Mood normal.        Behavior: Behavior normal.         Assessment And Plan:  Essential hypertension Assessment & Plan: Chronic, well controlled. She will continue with amlodipine 10mg  daily and lisinopril/hct 20/25mg  daily. Encouraged to follow low sodium diet.   Orders: -     Lipid panel -     CMP14+EGFR -     TSH  Prediabetes Assessment & Plan: Previous labs reviewed, her A1c has been elevated in the past. I will check an A1c today. Reminded to avoid refined sugars including sugary drinks/foods and processed meats including bacon, sausages and deli meats.    Orders: -     Hemoglobin A1c -     CMP14+EGFR  Pure hypercholesterolemia Assessment & Plan: Chronic, cardiac calcium scoring ordered earlier this year. Scheduling did attempt the call the patient; however, unable to leave msg. Referral coordinator advised to give pt information to call to schedule this testing. She is encouraged to follow a heart healthy lifestyle.  Orders: -     Lipid panel -     CMP14+EGFR  Chronic pain of left knee Assessment & Plan: Chronic, she agrees to Ortho evaluation. Advised radiographic studies are needed as part of her evaluation.   Orders: -     Ambulatory referral to Orthopedic Surgery  Estrogen deficiency -     DG Bone Density; Future  Class 1 obesity due to excess calories with serious comorbidity and body mass index (BMI) of 32.0 to 32.9 in adult Assessment & Plan: She is encouraged to strive for BMI  less than 30 to decrease cardiac risk. Advised to aim for at least 150 minutes of exercise per week.    Personal history of colon polyps, unspecified Assessment & Plan: Colonoscopy results reviewed. She is encouraged to continue with a high fiber diet.    Influenza vaccination declined Assessment & Plan: She also declined Shingrix and COVID vaccinations.      Return if symptoms worsen or fail to improve.  Patient was given opportunity to  ask questions. Patient verbalized understanding of the plan and was able to repeat key elements of the plan. All questions were answered to their satisfaction.    I, Alyssa Aliment, MD, have reviewed all documentation for this visit. The documentation on 12/25/22 for the exam, diagnosis, procedures, and orders are all accurate and complete.   IF YOU HAVE BEEN REFERRED TO A SPECIALIST, IT MAY TAKE 1-2 WEEKS TO SCHEDULE/PROCESS THE REFERRAL. IF YOU HAVE NOT HEARD FROM US/SPECIALIST IN TWO WEEKS, PLEASE GIVE [place] A CALL AT [phone].

## 2022-12-26 LAB — CMP14+EGFR
ALT: 14 [IU]/L (ref 0–32)
AST: 13 [IU]/L (ref 0–40)
Albumin: 4.4 g/dL (ref 3.8–4.8)
Alkaline Phosphatase: 54 [IU]/L (ref 44–121)
BUN/Creatinine Ratio: 15 (ref 12–28)
BUN: 15 mg/dL (ref 8–27)
Bilirubin Total: 0.3 mg/dL (ref 0.0–1.2)
CO2: 27 mmol/L (ref 20–29)
Calcium: 10.3 mg/dL (ref 8.7–10.3)
Chloride: 101 mmol/L (ref 96–106)
Creatinine, Ser: 1.01 mg/dL — ABNORMAL HIGH (ref 0.57–1.00)
Globulin, Total: 3.2 g/dL (ref 1.5–4.5)
Glucose: 85 mg/dL (ref 70–99)
Potassium: 4.9 mmol/L (ref 3.5–5.2)
Sodium: 141 mmol/L (ref 134–144)
Total Protein: 7.6 g/dL (ref 6.0–8.5)
eGFR: 59 mL/min/{1.73_m2} — ABNORMAL LOW (ref >59)

## 2022-12-26 LAB — LIPID PANEL
Chol/HDL Ratio: 3.3 ratio (ref 0.0–4.4)
Cholesterol, Total: 182 mg/dL (ref 100–199)
HDL: 56 mg/dL (ref >39)
LDL Chol Calc (NIH): 110 mg/dL — ABNORMAL HIGH (ref 0–99)
Triglycerides: 85 mg/dL (ref 0–149)
VLDL Cholesterol Cal: 16 mg/dL (ref 5–40)

## 2022-12-26 LAB — HEMOGLOBIN A1C
Est. average glucose Bld gHb Est-mCnc: 128 mg/dL
Hgb A1c MFr Bld: 6.1 % — ABNORMAL HIGH (ref 4.8–5.6)

## 2022-12-26 LAB — TSH: TSH: 1.67 u[IU]/mL (ref 0.450–4.500)

## 2023-01-04 ENCOUNTER — Encounter: Payer: Self-pay | Admitting: Orthopaedic Surgery

## 2023-01-04 ENCOUNTER — Other Ambulatory Visit (INDEPENDENT_AMBULATORY_CARE_PROVIDER_SITE_OTHER): Payer: Medicare HMO

## 2023-01-04 ENCOUNTER — Ambulatory Visit: Payer: Medicare HMO | Admitting: Orthopaedic Surgery

## 2023-01-04 DIAGNOSIS — G8929 Other chronic pain: Secondary | ICD-10-CM

## 2023-01-04 DIAGNOSIS — M25562 Pain in left knee: Secondary | ICD-10-CM | POA: Diagnosis not present

## 2023-01-04 NOTE — Progress Notes (Signed)
Office Visit Note   Patient: Alyssa Lee           Date of Birth: 10-25-1950           MRN: [id number] Visit Date: 01/04/2023              Requested by: Dorothyann Peng, MD [address] [place],  [place] [address] PCP: Dorothyann Peng, MD   Assessment & Plan: Visit Diagnoses:  1. Chronic pain of left knee     Plan: Alyssa Lee is a 72 year old female with left knee osteoarthritis with bone-on-bone changes in the medial compartment.  Treatment options were discussed and she would like to try outpatient physical therapy.  I have also recommended Voltaren gel.  Could consider knee compression sleeve during activity.  Follow-up as needed.  Follow-Up Instructions: No follow-ups on file.   Orders:  Orders Placed This Encounter  Procedures   XR KNEE 3 VIEW LEFT   Ambulatory referral to Physical Therapy   No orders of the defined types were placed in this encounter.     Procedures: No procedures performed   Clinical Data: No additional findings.   Subjective: Chief Complaint  Patient presents with   Left Knee - Pain    HPI Alyssa Lee is a 72 year old female who comes in for evaluation of a new problem of left knee pain.  It started hurting in September.  She has noticed popping which is painless.  She has difficulty extending her knee at times.  Denies any injuries.  Denies any mechanical symptoms or swelling.  Review of Systems  Constitutional: Negative.   HENT: Negative.    Eyes: Negative.   Respiratory: Negative.    Cardiovascular: Negative.   Endocrine: Negative.   Musculoskeletal: Negative.   Neurological: Negative.   Hematological: Negative.   Psychiatric/Behavioral: Negative.    All other systems reviewed and are negative.    Objective: Vital Signs: There were no vitals taken for this visit.  Physical Exam Vitals and nursing note reviewed.  Constitutional:      Appearance: She is well-developed.  HENT:     Head: Atraumatic.     Nose: Nose  normal.  Eyes:     Extraocular Movements: Extraocular movements intact.  Cardiovascular:     Pulses: Normal pulses.  Pulmonary:     Effort: Pulmonary effort is normal.  Abdominal:     Palpations: Abdomen is soft.  Musculoskeletal:     Cervical back: Neck supple.  Skin:    General: Skin is warm.     Capillary Refill: Capillary refill takes less than 2 seconds.  Neurological:     Mental Status: She is alert. Mental status is at baseline.  Psychiatric:        Behavior: Behavior normal.        Thought Content: Thought content normal.        Judgment: Judgment normal.     Ortho Exam Examination of the left knee shows no joint effusion.  Slight medial joint line tenderness.  Collaterals and cruciates are stable. Specialty Comments:  No specialty comments available.  Imaging: No results found.   PMFS History: Patient Active Problem List   Diagnosis Date Noted   Pure hypercholesterolemia 12/25/2022   Other abnormal glucose 12/25/2022   Chronic pain of left knee 12/25/2022   Estrogen deficiency 12/25/2022   Personal history of colon polyps, unspecified 12/25/2022   Class 1 obesity due to excess calories with serious comorbidity and body mass index (BMI) of 32.0 to 32.9  in adult 12/25/2022   Influenza vaccination declined 12/25/2022   Cough 07/31/2022   URI with cough and congestion 07/31/2022   Sinus pain 07/31/2022   De Quervain's tenosynovitis, right 03/15/2018   Ganglion cyst 03/14/2018   Essential hypertension    Prediabetes    Rhinitis, allergic 06/14/2017   Past Medical History:  Diagnosis Date   Hypertension     Family History  Problem Relation Age of Onset   Hyperthyroidism Mother    Hypertension Mother    Diabetes Mother    Hypertension Father    COPD Father    Kidney failure Father     Past Surgical History:  Procedure Laterality Date   CESAREAN SECTION     PARTIAL HYSTERECTOMY  2009   TONSILLECTOMY  2004   Social History   Occupational History    Occupation: retired  Tobacco Use   Smoking status: Never   Smokeless tobacco: Never  Vaping Use   Vaping status: Never Used  Substance and Sexual Activity   Alcohol use: Never   Drug use: Never   Sexual activity: Not Currently

## 2023-01-17 ENCOUNTER — Encounter: Payer: Self-pay | Admitting: Physical Therapy

## 2023-01-17 ENCOUNTER — Ambulatory Visit: Payer: Medicare HMO | Admitting: Physical Therapy

## 2023-01-17 DIAGNOSIS — M6281 Muscle weakness (generalized): Secondary | ICD-10-CM

## 2023-01-17 DIAGNOSIS — R262 Difficulty in walking, not elsewhere classified: Secondary | ICD-10-CM

## 2023-01-17 DIAGNOSIS — M25552 Pain in left hip: Secondary | ICD-10-CM

## 2023-01-17 DIAGNOSIS — M25562 Pain in left knee: Secondary | ICD-10-CM | POA: Diagnosis not present

## 2023-01-17 NOTE — Therapy (Signed)
OUTPATIENT PHYSICAL THERAPY LOWER EXTREMITY EVALUATION   Patient Name: Natilynn Durie MRN: [id number] DOB:[birth year], 72 y.o., female Today's Date: 01/17/2023  END OF SESSION:  PT End of Session - 01/17/23 0936     Visit Number 1    Number of Visits 20    Date for PT Re-Evaluation 03/28/23    PT Start Time 0932    PT Stop Time 1015    PT Time Calculation (min) 43 min    Activity Tolerance Patient tolerated treatment well    Behavior During Therapy [facility] for tasks assessed/performed             Past Medical History:  Diagnosis Date   Hypertension    Past Surgical History:  Procedure Laterality Date   CESAREAN SECTION     PARTIAL HYSTERECTOMY  2009   TONSILLECTOMY  2004   Patient Active Problem List   Diagnosis Date Noted   Pure hypercholesterolemia 12/25/2022   Other abnormal glucose 12/25/2022   Chronic pain of left knee 12/25/2022   Estrogen deficiency 12/25/2022   Personal history of colon polyps, unspecified 12/25/2022   Class 1 obesity due to excess calories with serious comorbidity and body mass index (BMI) of 32.0 to 32.9 in adult 12/25/2022   Influenza vaccination declined 12/25/2022   Cough 07/31/2022   URI with cough and congestion 07/31/2022   Sinus pain 07/31/2022   De Quervain's tenosynovitis, right 03/15/2018   Ganglion cyst 03/14/2018   Essential hypertension    Prediabetes    Rhinitis, allergic 06/14/2017    PCP: Dorothyann Peng, MD   REFERRING PROVIDER: Tarry Kos, MD   REFERRING DIAG:  Diagnosis  [id number] (ICD-10-CM) - Chronic pain of left knee    THERAPY DIAG:  Acute pain of left knee  Pain in left hip  Muscle weakness (generalized)  Difficulty in walking, not elsewhere classified  Rationale for Evaluation and Treatment: Rehabilitation  ONSET DATE: October 2024 SUBJECTIVE:   SUBJECTIVE STATEMENT: Pt arriving reporting pain beginning more around October this year. Pt stating pain began in her left knee and now has  moved into her left hip. Pt stating she is still able to do everyday activities but they can be painful.   PERTINENT HISTORY: Dr. Roda Shutters visit:  Jillianna is a 72 year old female with left knee osteoarthritis with bone-on-bone changes in the medial compartment. Treatment options were discussed and she would like to try outpatient physical therapy. I have also recommended Voltaren gel. Could consider knee compression sleeve during activity.     PAIN:  NPRS scale: no pain at rest, worse pain is 5/10 Pain location: left knee, left hip Pain description: achy, throbbing Aggravating factors: house hold chores, popping in knee Relieving factors: resting, sitting  PRECAUTIONS: None  WEIGHT BEARING RESTRICTIONS: No  FALLS:  Has patient fallen in last 6 months? No  LIVING ENVIRONMENT: Lives with: lives with their family and lives with their spouse Lives in: House/apartment Stairs: Yes: Internal: 15 steps; on left going up Has following equipment at home: None  OCCUPATION: Radiographer, therapeutic  PLOF: Independent  PATIENT GOALS: up and down stairs with no pain, be able to vacuum without pain  Next MD visit:   OBJECTIVE:   DIAGNOSTIC FINDINGS: X-rays of the left knee show advanced tricompartmental degenerative joint  disease.  Bone-on-bone joint space narrowing of the medial compartment.   PATIENT SURVEYS:  01/17/23 FOTO intake:    56%   COGNITION: Overall cognitive status: WFL    SENSATION: WFL    POSTURE:  rounded shoulders and forward head  PALPATION: TTP: around left knee joint line  LOWER EXTREMITY ROM:   ROM Right Eval 01/17/23 supine Left Eval 01/17/23 supine  Hip flexion    Hip extension    Hip abduction    Hip adduction    Hip internal rotation    Hip external rotation    Knee flexion A: 130 A: 125  Knee extension A: 0 A: 0  Ankle dorsiflexion    Ankle plantarflexion    Ankle inversion    Ankle eversion     (Blank rows = not tested)  LOWER EXTREMITY  MMT:  MMT Right 01/17/23 Left 01/17/23  Hip flexion 5 4  Hip extension    Hip abduction 5 5  Hip adduction 5 5  Hip internal rotation    Hip external rotation    Knee flexion 5 4  Knee extension 5 4  Ankle dorsiflexion    Ankle plantarflexion    Ankle inversion    Ankle eversion     (Blank rows = not tested)    FUNCTIONAL TESTS:  01/17/23: 5 time sit to stand: 9.3 seconds with no UE support  GAIT: Distance walked: 30 feet clinic distances Assistive device utilized: None Level of assistance: Complete Independence Comments: wide BOS                                                                                                                                                                        TODAY'S TREATMENT                                                                          DATE: 01/17/23 Therex: HEP instruction/performance c cues for techniques, handout provided.  Trial set performed of each for comprehension and symptom assessment.  See below for exercise list  PATIENT EDUCATION:  Education details: HEP, POC, pt encouraged to ride recumbent bike at local [employer] 2-3 times each week 15-20 minutes Person educated: Patient Education method: Explanation, Demonstration, Verbal cues, and Handouts Education comprehension: verbalized understanding, returned demonstration, and verbal cues required  HOME EXERCISE PROGRAM: Access Code: [email or web address] URL: [email or web address] Date: 01/17/2023 Prepared by: Narda Amber  Exercises - Supine Bridge  - 2 x daily - 7 x weekly - 2 sets - 10 reps - 5 seconds hold - Straight Leg Raise with External Rotation  - 2 x daily - 7 x weekly - 2 sets - 10 reps - Seated Hamstring Stretch  -  2 x daily - 7 x weekly - 3 reps - 30 seconds hold - Mini Squat with Counter Support  - 1-2 x daily - 7 x weekly - 2 sets - 10 reps  ASSESSMENT:  CLINICAL IMPRESSION: Patient is a 72 y.o. who comes to clinic with complaints of left knee  and left hip pain with mobility, strength and movement coordination deficits that impair their ability to perform usual daily and recreational functional activities without increase difficulty/symptoms at this time.  Patient to benefit from skilled PT services to address impairments and limitations to improve to previous level of function without restriction secondary to condition.   OBJECTIVE IMPAIRMENTS: decreased mobility, difficulty walking, decreased strength, increased edema, impaired flexibility, and pain.   ACTIVITY LIMITATIONS: lifting, bending, standing, squatting, sleeping, stairs, and transfers  PARTICIPATION LIMITATIONS: cleaning, laundry, driving, shopping, community activity, and occupation  PERSONAL FACTORS:  see pertinent history above  are also affecting patient's functional outcome.   REHAB POTENTIAL: Good  CLINICAL DECISION MAKING: Stable/uncomplicated  EVALUATION COMPLEXITY: Low   GOALS: Goals reviewed with patient? Yes  SHORT TERM GOALS: (target date for Short term goals are 3 weeks 02/07/2023)   1.  Patient will demonstrate independent use of home exercise program to maintain progress from in clinic treatments.  Goal status: New  LONG TERM GOALS: (target dates for all long term goals are 10 weeks  03/28/2023)   1. Patient will demonstrate/report pain at worst less than or equal to 2/10 to facilitate minimal limitation in daily activity secondary to pain symptoms.  Goal status: New   2. Patient will demonstrate independent use of home exercise program to facilitate ability to maintain/progress functional gains from skilled physical therapy services.  Goal status: New   3. Patient will demonstrate FOTO outcome > or = 68 % to indicate reduced disability due to condition.  Goal status: New   4.  Patient will demonstrate left  LE MMT 5/5 throughout to faciltiate usual transfers, stairs, squatting at [facility] for daily life.   Goal status: New   5.  Patient  will demonstrate recriprocal stair navigation up 1 flight of stairs with single hand rail.  Goal status: New   6.  Pt will be able to lift 10# from floor to counter height using correct body mechanics with left LE pain </= 2/10 Goal status: New      PLAN:  PT FREQUENCY: 1-2x/week  PT DURATION: 10 weeks  PLANNED INTERVENTIONS: Can include 64403- PT Re-evaluation, 97110-Therapeutic exercises, 97530- Therapeutic activity, 97112- Neuromuscular re-education, 97535- Self Care, 97140- Manual therapy, [id number]- Gait training, [phone]- Orthotic Fit/training, [phone]- Canalith repositioning, [id number]- Aquatic Therapy, 97014- Electrical stimulation (unattended), [id number]- Electrical stimulation (manual), [id number]- Vasopneumatic device, [id number]- Ultrasound, [id number]- Traction (mechanical), [id number]- Ionotophoresis 4mg /ml Dexamethasone, Patient/Family education, Balance training, Stair training, Taping, Dry Needling, Joint mobilization, Joint manipulation, Spinal manipulation, Spinal mobilization, Scar mobilization, Vestibular training, Visual/preceptual remediation/compensation, DME instructions, Cryotherapy, and Moist heat.  All performed as medically necessary.  All included unless contraindicated  PLAN FOR NEXT SESSION: Review HEP knowledge/results, quad strengthening, knee stability, balance         Sharmon Leyden, PT, MPT 01/17/2023, 10:18 AM

## 2023-01-26 ENCOUNTER — Encounter: Payer: Medicare HMO | Admitting: Physical Therapy

## 2023-01-29 ENCOUNTER — Ambulatory Visit (HOSPITAL_COMMUNITY)
Admission: RE | Admit: 2023-01-29 | Discharge: 2023-01-29 | Disposition: A | Discharge status: Home / Self Care | Payer: Self-pay | Source: Ambulatory Visit | Attending: Internal Medicine | Admitting: Internal Medicine

## 2023-01-29 DIAGNOSIS — E7800 Pure hypercholesterolemia, unspecified: Secondary | ICD-10-CM | POA: Insufficient documentation

## 2023-01-30 ENCOUNTER — Encounter: Payer: Medicare HMO | Admitting: Physical Therapy

## 2023-02-05 ENCOUNTER — Other Ambulatory Visit: Payer: Self-pay | Admitting: Nurse Practitioner

## 2023-02-12 ENCOUNTER — Encounter: Payer: Medicare HMO | Admitting: Physical Therapy

## 2023-02-19 ENCOUNTER — Encounter: Payer: Medicare HMO | Admitting: Physical Therapy

## 2023-05-23 ENCOUNTER — Ambulatory Visit: Payer: Self-pay

## 2023-05-23 DIAGNOSIS — Z0000 Encounter for general adult medical examination without abnormal findings: Secondary | ICD-10-CM | POA: Diagnosis not present

## 2023-05-23 NOTE — Patient Instructions (Signed)
 Alyssa Lee , Thank you for taking time to come for your Medicare Wellness Visit. I appreciate your ongoing commitment to your health goals. Please review the following plan we discussed and let me know if I can assist you in the future.   Referrals/Orders/Follow-Ups/Clinician Recommendations: none  This is a list of the screening recommended for you and due dates:  Health Maintenance  Topic Date Due   DEXA scan (bone density measurement)  Never done   COVID-19 Vaccine (3 - Moderna risk series) 10/05/2020   Zoster (Shingles) Vaccine (1 of 2) 08/22/2023*   Flu Shot  09/14/2023   Medicare Annual Wellness Visit  05/22/2024   Mammogram  07/05/2024   Colon Cancer Screening  12/20/2032   Pneumonia Vaccine  Completed   Hepatitis C Screening  Completed   HPV Vaccine  Aged Out   DTaP/Tdap/Td vaccine  Discontinued   Cologuard (Stool DNA test)  Discontinued  *Topic was postponed. The date shown is not the original due date.    Advanced directives: (ACP Link)Information on Advanced Care Planning can be found at [facility] [place] Advance Health Care Directives Advance Health Care Directives. [email or web address]   Next Medicare Annual Wellness Visit scheduled for next year: No, office will schedule  insert Preventive Care attachment Insert FALL PREVENTION attachment if needed

## 2023-05-23 NOTE — Progress Notes (Signed)
 Subjective:   Alyssa Lee is a 73 y.o. who presents for a Medicare Wellness preventive visit.  Visit Complete: Virtual I connected with  Alyssa Lee on 05/23/23 by a audio enabled telemedicine application and verified that I am speaking with the correct person using two identifiers.  Patient Location: Home  Provider Location: Office/Clinic  I discussed the limitations of evaluation and management by telemedicine. The patient expressed understanding and agreed to proceed.  Vital Signs: Because this visit was a virtual/telehealth visit, some criteria may be missing or patient reported. Any vitals not documented were not able to be obtained and vitals that have been documented are patient reported.  VideoError- Librarian, academic were attempted between this provider and patient, however failed, due to patient having technical difficulties OR patient did not have access to video capability.  We continued and completed visit with audio only.   Persons Participating in Visit: Patient.  AWV Questionnaire: Yes: Patient Medicare AWV questionnaire was completed by the patient on 05/23/2023; I have confirmed that all information answered by patient is correct and no changes since this date.  Cardiac Risk Factors include: advanced age (>61men, >33 women);hypertension     Objective:    Today's Vitals   There is no height or weight on file to calculate BMI.     05/23/2023    2:10 PM 01/17/2023    9:35 AM 05/17/2022    2:16 PM 05/11/2021    3:22 PM 01/27/2021    3:32 PM 12/24/2019    3:54 PM 12/18/2018   10:53 AM  Advanced Directives  Does Patient Have a Medical Advance Directive? No No No No No No No  Would patient like information on creating a medical advance directive?  No - Patient declined  No - Patient declined       Current Medications (verified) Outpatient Encounter Medications as of 05/23/2023  Medication Sig   amLODipine (NORVASC) 10 MG tablet TAKE  1 TABLET DAILY   Cholecalciferol (VITAMIN D3) 5000 units CAPS Take by mouth.   fluticasone (FLONASE) 50 MCG/ACT nasal spray USE 2 SPRAYS IN EACH NOSTRIL DAILY   lisinopril-hydrochlorothiazide (ZESTORETIC) 20-25 MG tablet TAKE 1 TABLET DAILY   NOREL AD 4-10-325 MG TABS Take 1 tablet by mouth 2 (two) times daily as needed.   vitamin C (ASCORBIC ACID) 500 MG tablet Take 500 mg by mouth daily.   benzonatate (TESSALON PERLES) 100 MG capsule Take 1 capsule (100 mg total) by mouth 3 (three) times daily as needed for up to 10 days for cough.   Elastic Bandages & Supports (THUMB SPLINT/RIGHT SMALL) MISC 1 Units by Does not apply route daily. (Patient not taking: Reported on 05/23/2023)   OVER THE COUNTER MEDICATION Probiotic with cranberry  1 sometimes (Patient not taking: Reported on 05/23/2023)   No facility-administered encounter medications on file as of 05/23/2023.    Allergies (verified) Patient has no known allergies.   History: Past Medical History:  Diagnosis Date   Allergy    Hypertension    Past Surgical History:  Procedure Laterality Date   ABDOMINAL HYSTERECTOMY     CESAREAN SECTION     PARTIAL HYSTERECTOMY  2009   TONSILLECTOMY  2004   Family History  Problem Relation Age of Onset   Hyperthyroidism Mother    Hypertension Mother    Diabetes Mother    Hypertension Father    COPD Father    Kidney failure Father    Kidney disease Father    Stroke  Son    Stroke Son    Social History   Socioeconomic History   Marital status: Married    Spouse name: Not on file   Number of children: Not on file   Years of education: Not on file   Highest education level: Associate degree: occupational, Scientist, product/process development, or vocational program  Occupational History   Occupation: retired  Tobacco Use   Smoking status: Never   Smokeless tobacco: Never  Vaping Use   Vaping status: Never Used  Substance and Sexual Activity   Alcohol use: Never   Drug use: Never   Sexual activity: Not Currently   Other Topics Concern   Not on file  Social History Narrative   Not on file   Social Drivers of Health   Financial Resource Strain: Low Risk  (05/23/2023)   Overall Financial Resource Strain (CARDIA)    Difficulty of Paying Living Expenses: Not very hard  Food Insecurity: No Food Insecurity (05/23/2023)   Hunger Vital Sign    Worried About Running Out of Food in the Last Year: Never true    Ran Out of Food in the Last Year: Never true  Transportation Needs: No Transportation Needs (05/23/2023)   PRAPARE - Administrator, Civil Service (Medical): No    Lack of Transportation (Non-Medical): No  Physical Activity: Insufficiently Active (05/23/2023)   Exercise Vital Sign    Days of Exercise per Week: 2 days    Minutes of Exercise per Session: 20 min  Stress: No Stress Concern Present (05/23/2023)   [employer] of Occupational Health - Occupational Stress Questionnaire    Feeling of Stress : Only a little  Social Connections: Socially Integrated (05/23/2023)   Social Connection and Isolation Panel [NHANES]    Frequency of Communication with Friends and Family: Twice a week    Frequency of Social Gatherings with Friends and Family: Twice a week    Attends Religious Services: More than 4 times per year    Active Member of [employer] or Organizations: Yes    Attends Engineer, structural: More than 4 times per year    Marital Status: Married    Tobacco Counseling Counseling given: Not Answered    Clinical Intake:  Pre-visit preparation completed: Yes  Pain : No/denies pain     Nutritional Risks: None Diabetes: No  Lab Results  Component Value Date   HGBA1C 6.1 (H) 12/25/2022   HGBA1C 6.3 (H) 06/21/2022   HGBA1C 6.2 (H) 01/16/2022     How often do you need to have someone help you when you read instructions, pamphlets, or other written materials from your doctor or pharmacy?: 1 - Never  Interpreter Needed?: No  Information entered by :: NAllen  LPN   Activities of Daily Living     05/23/2023   12:58 PM  In your present state of health, do you have any difficulty performing the following activities:  Hearing? 0  Vision? 0  Difficulty concentrating or making decisions? 0  Walking or climbing stairs? 0  Dressing or bathing? 0  Doing errands, shopping? 0  Preparing Food and eating ? N  Using the Toilet? N  In the past six months, have you accidently leaked urine? N  Do you have problems with loss of bowel control? N  Managing your Medications? N  Managing your Finances? N  Housekeeping or managing your Housekeeping? N    Patient Care Team: Dorothyann Peng, MD as PCP - General (Internal Medicine)  Indicate any recent  Medical Services you may have received from other than [facility] providers in the past year (date may be approximate).     Assessment:   This is a routine wellness examination for Liba.  Hearing/Vision screen Hearing Screening - Comments:: Denies hearing issues Vision Screening - Comments:: Regular eye exams, Prudencio Burly   Goals Addressed             This Visit's Progress    Patient Stated       05/23/2023, wants to maintain muscle and flexibility       Depression Screen     05/23/2023    2:11 PM 06/21/2022   10:46 AM 05/17/2022    2:17 PM 01/16/2022    3:06 PM 05/11/2021    3:23 PM 01/27/2021    3:33 PM 12/24/2019    3:56 PM  PHQ 2/9 Scores  PHQ - 2 Score 0 0 0 0 0 0 0  PHQ- 9 Score 0 0         Fall Risk     05/23/2023   12:58 PM 06/21/2022   10:46 AM 05/17/2022    2:17 PM 01/16/2022    3:06 PM 05/11/2021    3:23 PM  Fall Risk   Falls in the past year? 0 1 0 0 0  Number falls in past yr: 0 0 0 0   Injury with Fall? 0 1 0 0   Risk for fall due to : Medication side effect No Fall Risks Medication side effect No Fall Risks Medication side effect  Follow up Falls prevention discussed;Falls evaluation completed Falls evaluation completed Falls prevention discussed;Education provided;Falls  evaluation completed Falls evaluation completed Falls evaluation completed;Education provided;Falls prevention discussed    MEDICARE RISK AT HOME:  Medicare Risk at Home Any stairs in or around the home?: (Patient-Rptd) Yes If so, are there any without handrails?: (Patient-Rptd) No Home free of loose throw rugs in walkways, pet beds, electrical cords, etc?: (Patient-Rptd) No Adequate lighting in your home to reduce risk of falls?: (Patient-Rptd) Yes Life alert?: (Patient-Rptd) No Use of a cane, walker or w/c?: (Patient-Rptd) No Grab bars in the bathroom?: (Patient-Rptd) No Shower chair or bench in shower?: (Patient-Rptd) Yes Elevated toilet seat or a handicapped toilet?: (Patient-Rptd) Yes  TIMED UP AND GO:  Was the test performed?  No  Cognitive Function: 6CIT completed        05/23/2023    2:11 PM 05/17/2022    2:18 PM 05/11/2021    3:25 PM 01/27/2021    3:35 PM 12/24/2019    3:56 PM  6CIT Screen  What Year? 0 points 0 points 0 points 0 points 0 points  What month? 0 points 0 points 0 points 0 points 0 points  What time? 0 points 0 points 0 points 0 points 0 points  Count back from 20 0 points 0 points 0 points 0 points 0 points  Months in reverse 0 points 0 points 0 points 0 points 0 points  Repeat phrase 0 points 0 points 0 points 0 points 0 points  Total Score 0 points 0 points 0 points 0 points 0 points    Immunizations Immunization History  Administered Date(s) Administered   Influenza, High Dose Seasonal PF 12/12/2017, 12/18/2018   Moderna SARS-COV2 Booster Vaccination 01/23/2020, 09/07/2020   Moderna Sars-Covid-2 Vaccination 03/30/2019, 04/27/2019   PNEUMOCOCCAL CONJUGATE-20 10/18/2021    Screening Tests Health Maintenance  Topic Date Due   DEXA SCAN  Never done   COVID-19 Vaccine (3 - Moderna  risk series) 10/05/2020   Zoster Vaccines- Shingrix (1 of 2) 08/22/2023 (Originally 04/01/1969)   INFLUENZA VACCINE  09/14/2023   Medicare Annual Wellness (AWV)   05/22/2024   MAMMOGRAM  07/05/2024   Colonoscopy  12/20/2032   Pneumonia Vaccine 71+ Years old  Completed   Hepatitis C Screening  Completed   HPV VACCINES  Aged Out   DTaP/Tdap/Td  Discontinued   Fecal DNA (Cologuard)  Discontinued    Health Maintenance  Health Maintenance Due  Topic Date Due   DEXA SCAN  Never done   COVID-19 Vaccine (3 - Moderna risk series) 10/05/2020   Health Maintenance Items Addressed: Declines shingles vaccine. Covid vaccine due.  Additional Screening:  Vision Screening: Recommended annual ophthalmology exams for early detection of glaucoma and other disorders of the eye.  Dental Screening: Recommended annual dental exams for proper oral hygiene  Community Resource Referral / Chronic Care Management: CRR required this visit?  No   CCM required this visit?  No     Plan:     I have personally reviewed and noted the following in the patient's chart:   Medical and social history Use of alcohol, tobacco or illicit drugs  Current medications and supplements including opioid prescriptions. Patient is not currently taking opioid prescriptions. Functional ability and status Nutritional status Physical activity Advanced directives List of other physicians Hospitalizations, surgeries, and ER visits in previous 12 months Vitals Screenings to include cognitive, depression, and falls Referrals and appointments  In addition, I have reviewed and discussed with patient certain preventive protocols, quality metrics, and best practice recommendations. A written personalized care plan for preventive services as well as general preventive health recommendations were provided to patient.     Barb Merino, LPN   02/18/1094   After Visit Summary: (MyChart) Due to this being a telephonic visit, the after visit summary with patients personalized plan was offered to patient via MyChart   Notes: Nothing significant to report at this time.

## 2023-06-25 ENCOUNTER — Encounter: Payer: Self-pay | Admitting: Internal Medicine

## 2023-06-25 ENCOUNTER — Ambulatory Visit (INDEPENDENT_AMBULATORY_CARE_PROVIDER_SITE_OTHER): Payer: Medicare HMO | Admitting: Internal Medicine

## 2023-06-25 VITALS — BP 124/80 | HR 56 | Temp 98.3°F | Ht 59.0 in | Wt 164.8 lb

## 2023-06-25 DIAGNOSIS — H259 Unspecified age-related cataract: Secondary | ICD-10-CM | POA: Insufficient documentation

## 2023-06-25 DIAGNOSIS — Z0000 Encounter for general adult medical examination without abnormal findings: Secondary | ICD-10-CM | POA: Insufficient documentation

## 2023-06-25 DIAGNOSIS — N182 Chronic kidney disease, stage 2 (mild): Secondary | ICD-10-CM | POA: Diagnosis not present

## 2023-06-25 DIAGNOSIS — E7800 Pure hypercholesterolemia, unspecified: Secondary | ICD-10-CM

## 2023-06-25 DIAGNOSIS — E66811 Obesity, class 1: Secondary | ICD-10-CM

## 2023-06-25 DIAGNOSIS — E6609 Other obesity due to excess calories: Secondary | ICD-10-CM

## 2023-06-25 DIAGNOSIS — I129 Hypertensive chronic kidney disease with stage 1 through stage 4 chronic kidney disease, or unspecified chronic kidney disease: Secondary | ICD-10-CM | POA: Insufficient documentation

## 2023-06-25 DIAGNOSIS — R7303 Prediabetes: Secondary | ICD-10-CM

## 2023-06-25 DIAGNOSIS — J301 Allergic rhinitis due to pollen: Secondary | ICD-10-CM | POA: Diagnosis not present

## 2023-06-25 DIAGNOSIS — Z6833 Body mass index (BMI) 33.0-33.9, adult: Secondary | ICD-10-CM

## 2023-06-25 DIAGNOSIS — E559 Vitamin D deficiency, unspecified: Secondary | ICD-10-CM | POA: Insufficient documentation

## 2023-06-25 DIAGNOSIS — Z2821 Immunization not carried out because of patient refusal: Secondary | ICD-10-CM

## 2023-06-25 LAB — POCT URINALYSIS DIPSTICK
Bilirubin, UA: NEGATIVE
Blood, UA: NEGATIVE
Glucose, UA: NEGATIVE
Ketones, UA: NEGATIVE
Leukocytes, UA: NEGATIVE
Nitrite, UA: NEGATIVE
Protein, UA: NEGATIVE
Spec Grav, UA: 1.01 (ref 1.010–1.025)
Urobilinogen, UA: 0.2 U/dL
pH, UA: 6.5 (ref 5.0–8.0)

## 2023-06-25 MED ORDER — AMLODIPINE BESYLATE 10 MG PO TABS
10.0000 mg | ORAL_TABLET | Freq: Every day | ORAL | 3 refills | Status: AC
Start: 2023-06-25 — End: ?

## 2023-06-25 MED ORDER — FLUTICASONE PROPIONATE 50 MCG/ACT NA SUSP: 2.0000 | Freq: Every day | NASAL | 3 refills | Status: AC

## 2023-06-25 MED ORDER — LISINOPRIL-HYDROCHLOROTHIAZIDE 20-25 MG PO TABS: 1.0000 | ORAL_TABLET | Freq: Every day | ORAL | 3 refills | Status: AC

## 2023-06-25 NOTE — Progress Notes (Signed)
 I,Victoria T Basil Lim, CMA,acting as a Neurosurgeon for Smiley Dung, MD.,have documented all relevant documentation on the behalf of Smiley Dung, MD,as directed by  Smiley Dung, MD while in the presence of Smiley Dung, MD.  Subjective:  Patient ID: Alyssa Lee , female    DOB: 14-Aug-1950 , 73 y.o.   MRN: [id number]  Chief Complaint  Patient presents with   Annual Exam    The patient is here today for a physical examination. She is no longer followed by GYN. She has no specific concerns or complaints at this time. She denies chest pain, palpitations and shortness of breath.      HPI Discussed the use of AI scribe software for clinical note transcription with the patient, who gave verbal consent to proceed.  History of Present Illness Alyssa Lee is a 73 year old female who presents for a routine physical exam and blood pressure check.  She monitors her blood pressure at home, with readings around 124/80 mmHg, occasionally as low as 116 mmHg. She is currently taking Lisinopril  20 mg and Amlodipine , both of which are being refilled. She also uses Flonase  seasonally and takes high doses of vitamin D , specifically 5000 IU.  She has a history of chronic kidney disease, previously noted to be in stage three. She avoids NSAIDs like Motrin, Advil, and Aleve due to potential kidney issues.  Regarding her eye health, she might have early cataracts and plans to see an eye specialist. She had an eye exam in January at [facility] but was dissatisfied with the service. She does not drive much at night and does not experience difficulty seeing when she does.  She experiences occasional knee pain, for which she was given exercises by an orthopedic care provider. These exercises have improved her condition, and she continues to perform them as needed.  Her family history includes strokes in both her sons and her grandmother, and her father had COPD. She had a calcium score test last year,  which showed no calcifications.  No joint pain, does not take Tylenol or NSAIDs for pain, occasional headaches likely due to sinuses.   Hypertension This is a chronic problem. The current episode started more than 1 year ago. The problem has been gradually improving since onset. The problem is controlled. Pertinent negatives include no blurred vision. The current treatment provides moderate improvement. Hypertensive end-organ damage includes kidney disease.     Past Medical History:  Diagnosis Date   Allergy    Hypertension      Family History  Problem Relation Age of Onset   Hyperthyroidism Mother    Hypertension Mother    Diabetes Mother    Hypertension Father    COPD Father    Kidney failure Father    Kidney disease Father    Stroke Son    Stroke Son      Current Outpatient Medications:    Cholecalciferol (VITAMIN D3) 5000 units CAPS, Take by mouth., Disp: , Rfl:    NOREL AD 4-10-325 MG TABS, Take 1 tablet by mouth 2 (two) times daily as needed., Disp: 30 tablet, Rfl: 0   OVER THE COUNTER MEDICATION, Probiotic with cranberry  1 sometimes, Disp: , Rfl:    vitamin C (ASCORBIC ACID) 500 MG tablet, Take 500 mg by mouth daily., Disp: , Rfl:    amLODipine  (NORVASC ) 10 MG tablet, Take 1 tablet (10 mg total) by mouth daily., Disp: 90 tablet, Rfl: 3   benzonatate  (TESSALON  PERLES) 100 MG  capsule, Take 1 capsule (100 mg total) by mouth 3 (three) times daily as needed for up to 10 days for cough. (Patient not taking: Reported on 06/25/2023), Disp: 30 capsule, Rfl: 0   Elastic Bandages & Supports (THUMB SPLINT/RIGHT SMALL) MISC, 1 Units by Does not apply route daily. (Patient not taking: Reported on 05/23/2023), Disp: 1 each, Rfl: 0   fluticasone  (FLONASE ) 50 MCG/ACT nasal spray, Place 2 sprays into both nostrils daily., Disp: 48 g, Rfl: 3   lisinopril -hydrochlorothiazide  (ZESTORETIC ) 20-25 MG tablet, Take 1 tablet by mouth daily., Disp: 90 tablet, Rfl: 3   No Known Allergies   Review  of Systems  Constitutional: Negative.   HENT: Negative.    Eyes: Negative.  Negative for blurred vision.  Respiratory: Negative.    Cardiovascular: Negative.   Gastrointestinal: Negative.   Endocrine: Negative.   Genitourinary: Negative.   Musculoskeletal: Negative.   Skin: Negative.   Allergic/Immunologic: Negative.   Neurological: Negative.   Hematological: Negative.   Psychiatric/Behavioral: Negative.       Today's Vitals   06/25/23 1041  BP: 124/80  Pulse: (!) 56  Temp: 98.3 F (36.8 C)  TempSrc: Oral  Weight: 164 lb 12.8 oz (74.8 kg)  Height: 4\' 11"  (1.499 m)  PainSc: 0-No pain   Body mass index is 33.29 kg/m.  Wt Readings from Last 3 Encounters:  06/25/23 164 lb 12.8 oz (74.8 kg)  12/25/22 160 lb 9.6 oz (72.8 kg)  07/27/22 162 lb (73.5 kg)     Objective:  Physical Exam Vitals and nursing note reviewed.  Constitutional:      Appearance: Normal appearance.  HENT:     Head: Normocephalic and atraumatic.     Right Ear: Tympanic membrane, ear canal and external ear normal.     Left Ear: Tympanic membrane, ear canal and external ear normal.     Nose:     Comments: Masked     Mouth/Throat:     Comments: Masked  Eyes:     Extraocular Movements: Extraocular movements intact.     Conjunctiva/sclera: Conjunctivae normal.     Pupils: Pupils are equal, round, and reactive to light.  Cardiovascular:     Rate and Rhythm: Normal rate and regular rhythm.     Pulses:          Dorsalis pedis pulses are 3+ on the right side and 3+ on the left side.     Heart sounds: Normal heart sounds.  Pulmonary:     Effort: Pulmonary effort is normal.     Breath sounds: Normal breath sounds.  Chest:  Breasts:    Tanner Score is 5.     Right: Normal.     Left: Normal.  Abdominal:     General: Abdomen is flat. Bowel sounds are normal.     Palpations: Abdomen is soft.  Genitourinary:    Comments: deferred Musculoskeletal:        General: Normal range of motion.     Cervical  back: Normal range of motion and neck supple.  Skin:    General: Skin is warm and dry.  Neurological:     General: No focal deficit present.     Mental Status: She is alert and oriented to person, place, and time.  Psychiatric:        Mood and Affect: Mood normal.        Behavior: Behavior normal.         Assessment And Plan:  Encounter for annual physical exam Assessment &  Plan: A full exam was performed.  Importance of monthly self breast exams was discussed with the patient.  She is advised to get 30-45 minutes of regular exercise, no less than four to five days per week. Both weight-bearing and aerobic exercises are recommended.  She is advised to follow a healthy diet with at least six fruits/veggies per day, decrease intake of red meat and other saturated fats and to increase fish intake to twice weekly.  Meats/fish should not be fried -- baked, boiled or broiled is preferable. It is also important to cut back on your sugar intake.  Be sure to read labels - try to avoid anything with added sugar, high fructose corn syrup or other sweeteners.  If you must use a sweetener, you can try stevia or monkfruit.  It is also important to avoid artificially sweetened foods/beverages and diet drinks. Lastly, wear SPF 50 sunscreen on exposed skin and when in direct sunlight for an extended period of time.  Be sure to avoid fast food restaurants and aim for at least 60 ounces of water daily.       Hypertensive nephropathy Assessment & Plan: Chronic, controlled. EKG performed, NSR w/ first degree AV block. Denies cp, sob, fatigue. Hypertension well-controlled with current regimen. Advised against daily Norel AD use due to potential blood pressure increase. - Refill Lisinopril  20 mg. - Refill Amlodipine . - Schedule blood pressure check in six months.  Orders: -     EKG 12-Lead -     CBC -     CMP14+EGFR -     Lipid panel -     Lisinopril -hydroCHLOROthiazide ; Take 1 tablet by mouth daily.   Dispense: 90 tablet; Refill: 3 -     amLODIPine  Besylate; Take 1 tablet (10 mg total) by mouth daily.  Dispense: 90 tablet; Refill: 3 -     POCT urinalysis dipstick -     Microalbumin / creatinine urine ratio  Chronic kidney disease, stage II (mild) Assessment & Plan: Chronic kidney disease, she fluctuates between stage 2 and 3a - possibly due to slight dehydration. Advised increased hydration and NSAID avoidance to prevent kidney damage. - Encourage hydration before lab tests. - Avoid NSAIDs such as Motrin, Advil, Aleve.   Pure hypercholesterolemia Assessment & Plan: Chronic, she has had calcium score which was zero. Discussed use of Lp(a) to further assess cardiac risk. If elevated, should consider statin therapy.   - Order LP(a) test to assess genetic predisposition to heart disease.  Orders: -     Lipoprotein A (LPA)  Seasonal allergic rhinitis due to pollen Assessment & Plan: Allergic rhinitis managed with Flonase . Advised against daily Neoral use due to potential blood pressure increase. - Refill Flonase . - Advise against daily use of Norel AD due to potential increase in blood pressure due to decongestant.   Age-related cataract of both eyes, unspecified age-related cataract type Assessment & Plan: Early cataracts suspected. Referral to ophthalmologist for further evaluation. She prefers specific ophthalmologist despite wait. - Refer to ophthalmologist for cataract evaluation.  Orders: -     Ambulatory referral to Ophthalmology  Prediabetes Assessment & Plan: Previous labs reviewed, her A1c has been elevated in the past. I will check an A1c today. Reminded to avoid refined sugars including sugary drinks/foods and processed meats including bacon, sausages and deli meats.    Orders: -     CMP14+EGFR -     Hemoglobin A1c  Vitamin D  deficiency disease Assessment & Plan: I WILL CHECK A VIT D LEVEL  AND SUPPLEMENT AS NEEDED.  ALSO ENCOURAGED TO SPEND 15 MINUTES IN THE  SUN DAILY.   Orders: -     VITAMIN D  25 Hydroxy (Vit-D Deficiency, Fractures)  Class 1 obesity due to excess calories with serious comorbidity and body mass index (BMI) of 33.0 to 33.9 in adult Assessment & Plan: She is encouraged to strive for BMI less than 30 to decrease cardiac risk. Advised to aim for at least 150 minutes of exercise per week.    Herpes zoster vaccination declined  Other orders -     Fluticasone  Propionate; Place 2 sprays into both nostrils daily.  Dispense: 48 g; Refill: 3   Return in 6 months (on 12/26/2023) for 1 year physical.  Patient was given opportunity to ask questions. Patient verbalized understanding of the plan and was able to repeat key elements of the plan. All questions were answered to their satisfaction.   I, Smiley Dung, MD, have reviewed all documentation for this visit. The documentation on 06/25/23 for the exam, diagnosis, procedures, and orders are all accurate and complete.   IF YOU HAVE BEEN REFERRED TO A SPECIALIST, IT MAY TAKE 1-2 WEEKS TO SCHEDULE/PROCESS THE REFERRAL. IF YOU HAVE NOT HEARD FROM US /SPECIALIST IN TWO WEEKS, PLEASE GIVE US  A CALL AT [phone] X [phone].   THE PATIENT IS ENCOURAGED TO PRACTICE SOCIAL DISTANCING DUE TO THE COVID-19 PANDEMIC.

## 2023-06-25 NOTE — Assessment & Plan Note (Signed)
 She is encouraged to strive for BMI less than 30 to decrease cardiac risk. Advised to aim for at least 150 minutes of exercise per week.

## 2023-06-25 NOTE — Patient Instructions (Signed)

## 2023-06-25 NOTE — Assessment & Plan Note (Signed)
 Chronic kidney disease, she fluctuates between stage 2 and 3a - possibly due to slight dehydration. Advised increased hydration and NSAID avoidance to prevent kidney damage. - Encourage hydration before lab tests. - Avoid NSAIDs such as Motrin, Advil, Aleve.

## 2023-06-25 NOTE — Assessment & Plan Note (Signed)
 Early cataracts suspected. Referral to ophthalmologist for further evaluation. She prefers specific ophthalmologist despite wait. - Refer to ophthalmologist for cataract evaluation.

## 2023-06-25 NOTE — Assessment & Plan Note (Addendum)
 Chronic, controlled. EKG performed, NSR w/ first degree AV block. Denies cp, sob, fatigue. Hypertension well-controlled with current regimen. Advised against daily Norel AD use due to potential blood pressure increase. - Refill Lisinopril  20 mg. - Refill Amlodipine . - Schedule blood pressure check in six months.

## 2023-06-25 NOTE — Assessment & Plan Note (Signed)
 I WILL CHECK A VIT D LEVEL AND SUPPLEMENT AS NEEDED.  ALSO ENCOURAGED TO SPEND 15 MINUTES IN THE SUN DAILY.

## 2023-06-25 NOTE — Assessment & Plan Note (Signed)
 Allergic rhinitis managed with Flonase . Advised against daily Neoral use due to potential blood pressure increase. - Refill Flonase . - Advise against daily use of Norel AD due to potential increase in blood pressure due to decongestant.

## 2023-06-25 NOTE — Assessment & Plan Note (Signed)
 Previous labs reviewed, her A1c has been elevated in the past. I will check an A1c today. Reminded to avoid refined sugars including sugary drinks/foods and processed meats including bacon, sausages and deli meats.

## 2023-06-25 NOTE — Assessment & Plan Note (Signed)

## 2023-06-25 NOTE — Assessment & Plan Note (Signed)
 Chronic, she has had calcium score which was zero. Discussed use of Lp(a) to further assess cardiac risk. If elevated, should consider statin therapy.   - Order LP(a) test to assess genetic predisposition to heart disease.

## 2023-06-26 ENCOUNTER — Ambulatory Visit: Payer: Self-pay | Admitting: Internal Medicine

## 2023-06-26 LAB — MICROALBUMIN / CREATININE URINE RATIO
Creatinine, Urine: 55.7 mg/dL
Microalb/Creat Ratio: <5 mg/g{creat} (ref 0–29)
Microalbumin, Urine: <3 ug/mL

## 2023-06-26 LAB — CMP14+EGFR
ALT: 19 IU/L (ref 0–32)
AST: 15 IU/L (ref 0–40)
Albumin: 4.6 g/dL (ref 3.8–4.8)
Alkaline Phosphatase: 55 IU/L (ref 44–121)
BUN/Creatinine Ratio: 18 (ref 12–28)
BUN: 16 mg/dL (ref 8–27)
Bilirubin Total: 0.4 mg/dL (ref 0.0–1.2)
CO2: 24 mmol/L (ref 20–29)
Calcium: 10.4 mg/dL — ABNORMAL HIGH (ref 8.7–10.3)
Chloride: 100 mmol/L (ref 96–106)
Creatinine, Ser: 0.91 mg/dL (ref 0.57–1.00)
Globulin, Total: 3.2 g/dL (ref 1.5–4.5)
Glucose: 86 mg/dL (ref 70–99)
Potassium: 3.7 mmol/L (ref 3.5–5.2)
Sodium: 140 mmol/L (ref 134–144)
Total Protein: 7.8 g/dL (ref 6.0–8.5)
eGFR: 67 mL/min/{1.73_m2} (ref >59)

## 2023-06-26 LAB — CBC
Hematocrit: 41.5 % (ref 34.0–46.6)
Hemoglobin: 13.1 g/dL (ref 11.1–15.9)
MCH: 28.5 pg (ref 26.6–33.0)
MCHC: 31.6 g/dL (ref 31.5–35.7)
MCV: 90 fL (ref 79–97)
Platelets: 297 10*3/uL (ref 150–450)
RBC: 4.6 x10E6/uL (ref 3.77–5.28)
RDW: 13.3 % (ref 11.7–15.4)
WBC: 4.1 10*3/uL (ref 3.4–10.8)

## 2023-06-26 LAB — LIPID PANEL
Chol/HDL Ratio: 3.4 ratio (ref 0.0–4.4)
Cholesterol, Total: 200 mg/dL — ABNORMAL HIGH (ref 100–199)
HDL: 59 mg/dL (ref >39)
LDL Chol Calc (NIH): 131 mg/dL — ABNORMAL HIGH (ref 0–99)
Triglycerides: 57 mg/dL (ref 0–149)
VLDL Cholesterol Cal: 10 mg/dL (ref 5–40)

## 2023-06-26 LAB — VITAMIN D 25 HYDROXY (VIT D DEFICIENCY, FRACTURES): Vit D, 25-Hydroxy: 77.5 ng/mL (ref 30.0–100.0)

## 2023-06-26 LAB — LIPOPROTEIN A (LPA): Lipoprotein (a): 55.2 nmol/L (ref <75.0)

## 2023-06-26 LAB — HEMOGLOBIN A1C
Est. average glucose Bld gHb Est-mCnc: 134 mg/dL
Hgb A1c MFr Bld: 6.3 % — ABNORMAL HIGH (ref 4.8–5.6)

## 2023-08-09 ENCOUNTER — Other Ambulatory Visit: Payer: Medicare HMO

## 2023-09-12 ENCOUNTER — Other Ambulatory Visit (HOSPITAL_BASED_OUTPATIENT_CLINIC_OR_DEPARTMENT_OTHER)

## 2023-09-20 ENCOUNTER — Encounter: Payer: Self-pay | Admitting: Family Medicine

## 2023-09-20 ENCOUNTER — Ambulatory Visit: Admitting: Family Medicine

## 2023-09-20 VITALS — BP 110/60 | HR 71 | Temp 99.3°F | Ht 59.0 in | Wt 164.0 lb

## 2023-09-20 DIAGNOSIS — R0981 Nasal congestion: Secondary | ICD-10-CM

## 2023-09-20 DIAGNOSIS — E6609 Other obesity due to excess calories: Secondary | ICD-10-CM | POA: Diagnosis not present

## 2023-09-20 DIAGNOSIS — E66811 Obesity, class 1: Secondary | ICD-10-CM

## 2023-09-20 DIAGNOSIS — U071 COVID-19: Secondary | ICD-10-CM

## 2023-09-20 DIAGNOSIS — J069 Acute upper respiratory infection, unspecified: Secondary | ICD-10-CM

## 2023-09-20 DIAGNOSIS — Z6833 Body mass index (BMI) 33.0-33.9, adult: Secondary | ICD-10-CM

## 2023-09-20 DIAGNOSIS — R5383 Other fatigue: Secondary | ICD-10-CM

## 2023-09-20 DIAGNOSIS — R059 Cough, unspecified: Secondary | ICD-10-CM

## 2023-09-20 DIAGNOSIS — R058 Other specified cough: Secondary | ICD-10-CM

## 2023-09-20 LAB — POC COVID19 BINAXNOW: SARS Coronavirus 2 Ag: POSITIVE — AB

## 2023-09-20 MED ORDER — BENZONATATE 100 MG PO CAPS
100.0000 mg | ORAL_CAPSULE | Freq: Three times a day (TID) | ORAL | 0 refills | Status: DC | PRN
Start: 2023-09-20 — End: 2023-12-13

## 2023-09-20 MED ORDER — PAXLOVID (150/100) 10 X 150 MG & 10 X 100MG PO TBPK: 2.0000 | ORAL_TABLET | Freq: Two times a day (BID) | ORAL | 0 refills | Status: AC

## 2023-09-20 MED ORDER — PREDNISONE 5 MG PO TABS: ORAL_TABLET | ORAL | 0 refills | Status: DC

## 2023-09-20 NOTE — Progress Notes (Addendum)
 I,[name], CMA,acting as a Neurosurgeon for [employer], NP.,have documented all relevant documentation on the behalf of Bruna Creighton, NP,as directed by  Bruna Creighton, NP while in the presence of Bruna Creighton, NP.  Subjective:  Patient ID: Alyssa Lee , female    DOB: 15-Feb-1950 , 73 y.o.   MRN: [id number]  Chief Complaint  Patient presents with   URI    Patient presents today for cold symptoms. She reports her symtoms started about 3 days ago. She has a headache, fatigue, nasal congestion and productive cough. She has been using flonase  and taking norel AD.     HPI Discussed the use of AI scribe software for clinical note transcription with the patient, who gave verbal consent to proceed.  History of Present Illness     Alyssa Lee is a 73 year old female who presents with upper respiratory complaints.  She has been experiencing sinus congestion, a deep and irritating cough rated seven out of ten in severity, and headaches for the past three days. She has been using over-the-counter Norel AD and Flonase  for symptom relief. She has previously used Tessalon  pills for cough management but is uncertain about the effectiveness of Norel at this time.  She notes a lack of appetite and has not eaten much in the last two days. She mentions feeling chills but has not measured a high fever, with a temperature recorded at 99 degrees Fahrenheit. No shortness of breath, and she does not feel like she is wheezing, although she experiences a loose cough.  She also denies any nausea, vomiting or diarrhea.Patient is advised to continue exercise /walks as tolerated.  She has not been in contact with anyone known to be sick, but her child's school recently started, and she was present on the first day, which she suspects could be a source of exposure.  She reports having body aches initially, which have since resolved. No history of diabetes and has not previously tested positive for COVID-19.       Past Medical History:  Diagnosis Date   Allergy    Hypertension      Family History  Problem Relation Age of Onset   Hyperthyroidism Mother    Hypertension Mother    Diabetes Mother    Hypertension Father    COPD Father    Kidney failure Father    Kidney disease Father    Stroke Son    Stroke Son      Current Outpatient Medications:    amLODipine  (NORVASC ) 10 MG tablet, Take 1 tablet (10 mg total) by mouth daily., Disp: 90 tablet, Rfl: 3   Cholecalciferol (VITAMIN D3) 5000 units CAPS, Take by mouth., Disp: , Rfl:    Elastic Bandages & Supports (THUMB SPLINT/RIGHT SMALL) MISC, 1 Units by Does not apply route daily., Disp: 1 each, Rfl: 0   fluticasone  (FLONASE ) 50 MCG/ACT nasal spray, Place 2 sprays into both nostrils daily., Disp: 48 g, Rfl: 3   lisinopril -hydrochlorothiazide  (ZESTORETIC ) 20-25 MG tablet, Take 1 tablet by mouth daily., Disp: 90 tablet, Rfl: 3   NOREL AD 4-10-325 MG TABS, Take 1 tablet by mouth 2 (two) times daily as needed., Disp: 30 tablet, Rfl: 0   OVER THE COUNTER MEDICATION, Probiotic with cranberry  1 sometimes, Disp: , Rfl:    predniSONE  (DELTASONE ) 5 MG tablet, TAKE MEDICATION AS PRESCRIBED, Disp: 21 tablet, Rfl: 0   vitamin C (ASCORBIC ACID) 500 MG tablet, Take 500 mg by mouth daily., Disp: , Rfl:  benzonatate  (TESSALON  PERLES) 100 MG capsule, Take 1 capsule (100 mg total) by mouth 3 (three) times daily as needed for up to 10 days for cough., Disp: 30 capsule, Rfl: 0   No Known Allergies   Review of Systems  Constitutional:  Positive for appetite change and chills.  HENT:  Positive for congestion.   Respiratory:  Positive for cough. Negative for shortness of breath and wheezing.   Cardiovascular: Negative.  Negative for chest pain.  Gastrointestinal:  Negative for diarrhea, nausea and vomiting.  Musculoskeletal: Negative.   Neurological:  Negative for dizziness and headaches.  Psychiatric/Behavioral: Negative.       Today's Vitals   09/20/23  1532  BP: 110/60  Pulse: 71  Temp: 99.3 F (37.4 C)  TempSrc: Oral  Weight: 164 lb (74.4 kg)  Height: 4' 11 (1.499 m)   Body mass index is 33.12 kg/m.  Wt Readings from Last 3 Encounters:  09/20/23 164 lb (74.4 kg)  06/25/23 164 lb 12.8 oz (74.8 kg)  12/25/22 160 lb 9.6 oz (72.8 kg)    The 10-year ASCVD risk score (Arnett DK, et al., 2019) is: 11.1%   Values used to calculate the score:     Age: 70 years     Clincally relevant sex: Female     Is Non-Hispanic African American: Yes     Diabetic: No     Tobacco smoker: No     Systolic Blood Pressure: 110 mmHg     Is BP treated: Yes     HDL Cholesterol: 59 mg/dL     Total Cholesterol: 200 mg/dL  Objective:  Physical Exam HENT:     Head: Normocephalic.  Cardiovascular:     Rate and Rhythm: Normal rate.  Pulmonary:     Effort: Pulmonary effort is normal.     Breath sounds: Normal breath sounds.  Skin:    General: Skin is warm and dry.  Neurological:     Mental Status: She is alert and oriented to person, place, and time. Mental status is at baseline.         Assessment And Plan:  Nasal congestion -     POC COVID-19 BinaxNow  Cough in adult Assessment & Plan: - Prescribed Tessalon  perles as needed for cough. - Prescribed low-dose prednisone  pack for six days.  Orders: -     POC COVID-19 BinaxNow -     predniSONE ; TAKE MEDICATION AS PRESCRIBED  Dispense: 21 tablet; Refill: 0 -     Benzonatate ; Take 1 capsule (100 mg total) by mouth 3 (three) times daily as needed for up to 10 days for cough.  Dispense: 30 capsule; Refill: 0  COVID-19 Assessment & Plan: - Prescribed Paxlovid . - Advised mask use and isolation for five days from symptom onset. - Encouraged intake of liquids and light foods to maintain caloric intake.  Orders: -     Paxlovid  (150/100); Take 2 tablets by mouth 2 (two) times daily for 5 days. Dosage for moderate renal impairment (eGFR >/= 30 to <60 mL/min): 150 mg nirmatrelvir (one 150 mg tablet)  with 100 mg ritonavir (one 100 mg tablet), with both tablets taken together twice daily for 5 days. Not recommended if eGFR < 30 mL/min. PAXLOVID  is not recommend in patients with severe hepatic impairment (Child-Pugh Class C).  Dispense: 20 tablet; Refill: 0  Class 1 obesity due to excess calories with serious comorbidity and body mass index (BMI) of 33.0 to 33.9 in adult Assessment & Plan: She is encouraged to  strive for BMI less than 30 to decrease cardiac risk. Advised to aim for at least 150 minutes of exercise per week.      Assessment & Plan COVID-19 infection Confirmed COVID-19 infection with symptoms including fever, chills, cough, headache, and loss of appetite. No shortness of breath. No prior COVID-19 infection or Paxlovid  treatment.   Acute cough and upper respiratory symptoms Acute cough with upper respiratory symptoms. Cough is deep and loose. Using Norel and Flonase . Body aches resolved.  - Advised to hold off on Norel and use for future sinus issues if needed. - Recommended Tylenol for body aches or fever.   Return if symptoms worsen or fail to improve.  Patient was given opportunity to ask questions. Patient verbalized understanding of the plan and was able to repeat key elements of the plan. All questions were answered to their satisfaction.    I, Bruna Creighton, NP, have reviewed all documentation for this visit. The documentation on 10/02/2023 for the exam, diagnosis, procedures, and orders are all accurate and complete.    IF YOU HAVE BEEN REFERRED TO A SPECIALIST, IT MAY TAKE 1-2 WEEKS TO SCHEDULE/PROCESS THE REFERRAL. IF YOU HAVE NOT HEARD FROM US /SPECIALIST IN TWO WEEKS, PLEASE GIVE US  A CALL AT [phone].

## 2023-10-03 DIAGNOSIS — R0981 Nasal congestion: Secondary | ICD-10-CM | POA: Insufficient documentation

## 2023-10-03 DIAGNOSIS — R059 Cough, unspecified: Secondary | ICD-10-CM | POA: Insufficient documentation

## 2023-10-03 DIAGNOSIS — U071 COVID-19: Secondary | ICD-10-CM | POA: Insufficient documentation

## 2023-10-03 NOTE — Assessment & Plan Note (Signed)
-   Prescribed Paxlovid . - Advised mask use and isolation for five days from symptom onset. - Encouraged intake of liquids and light foods to maintain caloric intake.

## 2023-10-03 NOTE — Assessment & Plan Note (Signed)
 She is encouraged to strive for BMI less than 30 to decrease cardiac risk. Advised to aim for at least 150 minutes of exercise per week.

## 2023-10-03 NOTE — Assessment & Plan Note (Signed)
-   Prescribed Tessalon  perles as needed for cough. - Prescribed low-dose prednisone  pack for six days.

## 2023-10-05 ENCOUNTER — Ambulatory Visit: Admitting: Family Medicine

## 2023-10-05 VITALS — BP 120/70 | HR 64 | Temp 98.2°F | Ht 59.0 in | Wt 164.0 lb

## 2023-10-05 DIAGNOSIS — Z6833 Body mass index (BMI) 33.0-33.9, adult: Secondary | ICD-10-CM

## 2023-10-05 DIAGNOSIS — M542 Cervicalgia: Secondary | ICD-10-CM

## 2023-10-05 DIAGNOSIS — E7800 Pure hypercholesterolemia, unspecified: Secondary | ICD-10-CM

## 2023-10-05 DIAGNOSIS — M62838 Other muscle spasm: Secondary | ICD-10-CM

## 2023-10-05 DIAGNOSIS — E66811 Obesity, class 1: Secondary | ICD-10-CM

## 2023-10-05 DIAGNOSIS — I10 Essential (primary) hypertension: Secondary | ICD-10-CM

## 2023-10-05 DIAGNOSIS — E6609 Other obesity due to excess calories: Secondary | ICD-10-CM

## 2023-10-05 MED ORDER — METHOCARBAMOL 500 MG PO TABS: 500.0000 mg | ORAL_TABLET | Freq: Three times a day (TID) | ORAL | 1 refills | Status: DC | PRN
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
## 2023-10-27 DIAGNOSIS — M542 Cervicalgia: Secondary | ICD-10-CM | POA: Insufficient documentation

## 2023-10-27 DIAGNOSIS — M62838 Other muscle spasm: Secondary | ICD-10-CM | POA: Insufficient documentation

## 2023-10-27 NOTE — Assessment & Plan Note (Signed)
 Hypertension is being managed with current medications including lisinopril  and amlodipine .

## 2023-10-27 NOTE — Assessment & Plan Note (Signed)
 Hyperlipidemia with recent increase in total cholesterol to 200 mg/dL, primarily due to elevated LDL levels. HDL and triglycerides are within normal range. - Advise to continue reducing meat intake and consider increasing fish consumption to manage LDL levels. - Monitor cholesterol levels and adjust dietary habits as needed.

## 2023-12-13 ENCOUNTER — Ambulatory Visit (INDEPENDENT_AMBULATORY_CARE_PROVIDER_SITE_OTHER): Admitting: Family Medicine

## 2023-12-13 ENCOUNTER — Ambulatory Visit: Attending: Family Medicine

## 2023-12-13 ENCOUNTER — Encounter: Payer: Self-pay | Admitting: Family Medicine

## 2023-12-13 VITALS — BP 116/80 | HR 59 | Temp 98.7°F | Ht 59.0 in | Wt 163.0 lb

## 2023-12-13 DIAGNOSIS — R002 Palpitations: Secondary | ICD-10-CM | POA: Insufficient documentation

## 2023-12-13 DIAGNOSIS — Z2821 Immunization not carried out because of patient refusal: Secondary | ICD-10-CM

## 2023-12-13 DIAGNOSIS — R42 Dizziness and giddiness: Secondary | ICD-10-CM | POA: Diagnosis not present

## 2023-12-13 DIAGNOSIS — R001 Bradycardia, unspecified: Secondary | ICD-10-CM | POA: Insufficient documentation

## 2023-12-13 NOTE — Progress Notes (Signed)
 I,[name], CMA,acting as a neurosurgeon for [employer], NP.,have documented all relevant documentation on the behalf of Bruna Creighton, NP,as directed by  Bruna Creighton, NP while in the presence of Bruna Creighton, NP.  Subjective:  Patient ID: Alyssa Lee , female    DOB: 03/20/1950 , 73 y.o.   MRN: [id number]  Chief Complaint  Patient presents with   Joint Pain    Patient presents today for joint pain.   Palpitations    Patient reports she has been having heart palpitations. She noticed it last week when she was laying down.    HPI Discussed the use of AI scribe software for clinical note transcription with the patient, who gave verbal consent to proceed.  History of Present Illness     Alyssa Lee is a 73 year old female who presents with dizziness and chest palpitations following a fall.  She experienced dizziness and chest palpitations following a fall on Monday. The dizziness was described as a sensation of her head 'swimming' and 'spinning', occurring suddenly while walking out of a store. She tripped over a cement parking block, resulting in a fall that caused minor injuries to her wrist and knees. The dizziness improved after sitting in her car for about fifteen minutes and resolved about an hour later, although she felt weak afterwards.  She had experienced palpitations prior to the fall, particularly noticeable when lying down. She associates her symptoms with her family history of thyroid  issues, as her mother has a thyroid  condition.  In addition to the dizziness and palpitations, she experiences body aches, particularly in her hips and knees, which worsen with rain. She also feels tired, attributing it to her age and activity level.  She drinks one to two bottles of water daily, each containing 16.9 ounces. She monitors her blood pressure at home, which typically ranges from 110-118/80-86 mmHg, with no significant deviations except for one instance of low blood pressure.  Her current medications include blood pressure medication that contains a diuretic. No issues with frequent urination or dehydration are reported.      Past Medical History:  Diagnosis Date   Allergy    Hypertension      Family History  Problem Relation Age of Onset   Hyperthyroidism Mother    Hypertension Mother    Diabetes Mother    Hypertension Father    COPD Father    Kidney failure Father    Kidney disease Father    Stroke Son    Stroke Son      Current Outpatient Medications:    amLODipine  (NORVASC ) 10 MG tablet, Take 1 tablet (10 mg total) by mouth daily., Disp: 90 tablet, Rfl: 3   Cholecalciferol (VITAMIN D3) 5000 units CAPS, Take by mouth., Disp: , Rfl:    Elastic Bandages & Supports (THUMB SPLINT/RIGHT SMALL) MISC, 1 Units by Does not apply route daily., Disp: 1 each, Rfl: 0   fluticasone  (FLONASE ) 50 MCG/ACT nasal spray, Place 2 sprays into both nostrils daily., Disp: 48 g, Rfl: 3   lisinopril -hydrochlorothiazide  (ZESTORETIC ) 20-25 MG tablet, Take 1 tablet by mouth daily., Disp: 90 tablet, Rfl: 3   OVER THE COUNTER MEDICATION, Probiotic with cranberry  1 sometimes, Disp: , Rfl:    vitamin C (ASCORBIC ACID) 500 MG tablet, Take 500 mg by mouth daily., Disp: , Rfl:    No Known Allergies   Review of Systems  Constitutional: Negative.   HENT: Negative.    Respiratory: Negative.    Cardiovascular:  Positive  for palpitations.  Gastrointestinal: Negative.   Musculoskeletal:  Positive for arthralgias.  Neurological:  Positive for dizziness.     Today's Vitals   12/13/23 1541  BP: 116/80  Pulse: (!) 59  Temp: 98.7 F (37.1 C)  TempSrc: Oral  Weight: 163 lb (73.9 kg)  Height: 4' 11 (1.499 m)  PainSc: 4   PainLoc: Generalized   Body mass index is 32.92 kg/m.  Wt Readings from Last 3 Encounters:  12/13/23 163 lb (73.9 kg)  10/05/23 164 lb (74.4 kg)  09/20/23 164 lb (74.4 kg)    The 10-year ASCVD risk score (Arnett DK, et al., 2019) is: 12.1%   Values  used to calculate the score:     Age: 58 years     Clincally relevant sex: Female     Is Non-Hispanic African American: Yes     Diabetic: No     Tobacco smoker: No     Systolic Blood Pressure: 116 mmHg     Is BP treated: Yes     HDL Cholesterol: 59 mg/dL     Total Cholesterol: 200 mg/dL  Objective:  Physical Exam HENT:     Head: Normocephalic.  Cardiovascular:     Rate and Rhythm: Bradycardia present.  Pulmonary:     Effort: Pulmonary effort is normal.     Breath sounds: Normal breath sounds.  Musculoskeletal:        General: Tenderness present.  Neurological:     Mental Status: She is alert and oriented to person, place, and time. Mental status is at baseline.         Assessment And Plan:  Palpitations -     EKG 12-Lead -     LONG TERM MONITOR (3-14 DAYS) -     TSH -     BMP8+eGFR -     Magnesium  Influenza vaccination declined  Dizziness -     LONG TERM MONITOR (3-14 DAYS) -     BMP8+eGFR    Return if symptoms worsen or fail to improve, for keep next appt.  Patient was given opportunity to ask questions. Patient verbalized understanding of the plan and was able to repeat key elements of the plan. All questions were answered to their satisfaction.    I, Bruna Creighton, NP, have reviewed all documentation for this visit. The documentation on 12/13/2023 for the exam, diagnosis, procedures, and orders are all accurate and complete.   IF YOU HAVE BEEN REFERRED TO A SPECIALIST, IT MAY TAKE 1-2 WEEKS TO SCHEDULE/PROCESS THE REFERRAL. IF YOU HAVE NOT HEARD FROM US /SPECIALIST IN TWO WEEKS, PLEASE GIVE US  A CALL AT [phone].

## 2023-12-13 NOTE — Progress Notes (Unsigned)
 EP to read.

## 2023-12-14 ENCOUNTER — Ambulatory Visit: Payer: Self-pay | Admitting: Family Medicine

## 2023-12-14 LAB — BMP8+EGFR
BUN/Creatinine Ratio: 17 (ref 12–28)
BUN: 16 mg/dL (ref 8–27)
CO2: 25 mmol/L (ref 20–29)
Calcium: 9.9 mg/dL (ref 8.7–10.3)
Chloride: 100 mmol/L (ref 96–106)
Creatinine, Ser: 0.95 mg/dL (ref 0.57–1.00)
Glucose: 103 mg/dL — ABNORMAL HIGH (ref 70–99)
Potassium: 4.2 mmol/L (ref 3.5–5.2)
Sodium: 138 mmol/L (ref 134–144)
eGFR: 63 mL/min/1.73 (ref >59)

## 2023-12-14 LAB — MAGNESIUM: Magnesium: 2.2 mg/dL (ref 1.6–2.3)

## 2023-12-14 LAB — TSH: TSH: 2.13 u[IU]/mL (ref 0.450–4.500)

## 2023-12-14 NOTE — Progress Notes (Signed)
 Your thyroid  hormone, electrolytes and magnesium are normal. Let's wait for for the outcome of the zio patch monitor if anything comes up. So please watch out for it in your mailbox   Thank you!

## 2023-12-17 ENCOUNTER — Encounter: Payer: Self-pay | Admitting: Radiology

## 2023-12-31 ENCOUNTER — Ambulatory Visit: Attending: Internal Medicine

## 2023-12-31 ENCOUNTER — Ambulatory Visit (INDEPENDENT_AMBULATORY_CARE_PROVIDER_SITE_OTHER): Payer: Self-pay | Admitting: Internal Medicine

## 2023-12-31 VITALS — BP 110/60 | HR 59 | Temp 98.1°F | Ht 59.0 in | Wt 164.0 lb

## 2023-12-31 DIAGNOSIS — Z2821 Immunization not carried out because of patient refusal: Secondary | ICD-10-CM

## 2023-12-31 DIAGNOSIS — N182 Chronic kidney disease, stage 2 (mild): Secondary | ICD-10-CM

## 2023-12-31 DIAGNOSIS — E7800 Pure hypercholesterolemia, unspecified: Secondary | ICD-10-CM

## 2023-12-31 DIAGNOSIS — E6609 Other obesity due to excess calories: Secondary | ICD-10-CM

## 2023-12-31 DIAGNOSIS — E66811 Obesity, class 1: Secondary | ICD-10-CM

## 2023-12-31 DIAGNOSIS — R002 Palpitations: Secondary | ICD-10-CM

## 2023-12-31 DIAGNOSIS — I129 Hypertensive chronic kidney disease with stage 1 through stage 4 chronic kidney disease, or unspecified chronic kidney disease: Secondary | ICD-10-CM

## 2023-12-31 DIAGNOSIS — Z6833 Body mass index (BMI) 33.0-33.9, adult: Secondary | ICD-10-CM

## 2023-12-31 DIAGNOSIS — R7303 Prediabetes: Secondary | ICD-10-CM | POA: Diagnosis not present

## 2023-12-31 NOTE — Assessment & Plan Note (Addendum)
 Chronic, well controlled.  She will continue with amlodipine  10mg  daily and lisinopril /hydrochlorothiazide  20/25mg  daily. Follow low sodium diet.

## 2023-12-31 NOTE — Assessment & Plan Note (Signed)
 Intermittent palpitations with decreased frequency. No Zio monitor received. - Resent Zio monitor order.

## 2023-12-31 NOTE — Assessment & Plan Note (Signed)
 Chronic, she does not wish to start statin therapy. She is encouraged to follow heart healthy lifestyle. ASCVD risk is 11%.

## 2023-12-31 NOTE — Assessment & Plan Note (Signed)
 Chronic, she is encouraged to keep BP well controlled and to stay well hydrated to decrease risk of CKD progression.

## 2023-12-31 NOTE — Progress Notes (Signed)
 I,Alyssa J Llittleton, CMA,acting as a neurosurgeon for Alyssa LOISE Slocumb, MD.,have documented all relevant documentation on the behalf of Alyssa LOISE Slocumb, MD,as directed by  Alyssa LOISE Slocumb, MD while in the presence of Alyssa LOISE Slocumb, MD.  Subjective:  Patient ID: Alyssa Lee , female    DOB: Jun 23, 1950 , 73 y.o.   MRN: [id number]  Chief Complaint  Patient presents with   Hypertension    Patient presents today for a bp and pre-dm follow up, Patient reports compliance with medication. Patient denies having any chest pain, SOB, or headaches. Patient has no concerns today.     HPI Discussed the use of AI scribe software for clinical note transcription with the patient, who gave verbal consent to proceed.  History of Present Illness Alyssa Lee is a 73 year old female with hypertension who presents for a blood pressure check and palpitations.  She experiences palpitations that were initially frequent but have decreased over time. She has not received the heart monitor that was ordered, and she is unsure why it was marked as started. She has adjusted the timing of her blood pressure medication, which may have influenced her symptoms.  Her current medications include amlodipine  10 mg, lisinopril , and hydrochlorothiazide  20/12.5 mg. She has not been on any cholesterol medications. She drinks coffee in the morning and consumes two to three bottles of water daily, each 16.9 ounces.  She has been incorporating more salads into her diet, including a cucumber, tomato, and onion salad with apple cider vinegar, and is considering adding avocado. She takes vitamin D3 and K2 supplements, with a dose of 5000 IU of vitamin D .  She engages in some walking for exercise, although less frequently since her dog passed away. She is trying to manage her cholesterol through diet and exercise.   Hypertension This is a chronic problem. The current episode started more than 1 year ago. The problem has been gradually  improving since onset. The problem is controlled. Pertinent negatives include no blurred vision, chest pain, palpitations or shortness of breath. Risk factors for coronary artery disease include dyslipidemia and post-menopausal state. Past treatments include diuretics and ACE inhibitors. The current treatment provides moderate improvement. Hypertensive end-organ damage includes kidney disease.     Past Medical History:  Diagnosis Date   Allergy    Hypertension      Family History  Problem Relation Age of Onset   Hyperthyroidism Mother    Hypertension Mother    Diabetes Mother    Hypertension Father    COPD Father    Kidney failure Father    Kidney disease Father    Stroke Son    Stroke Son      Current Outpatient Medications:    amLODipine  (NORVASC ) 10 MG tablet, Take 1 tablet (10 mg total) by mouth daily., Disp: 90 tablet, Rfl: 3   Cholecalciferol (VITAMIN D3) 5000 units CAPS, Take by mouth., Disp: , Rfl:    Elastic Bandages & Supports (THUMB SPLINT/RIGHT SMALL) MISC, 1 Units by Does not apply route daily., Disp: 1 each, Rfl: 0   fluticasone  (FLONASE ) 50 MCG/ACT nasal spray, Place 2 sprays into both nostrils daily., Disp: 48 g, Rfl: 3   lisinopril -hydrochlorothiazide  (ZESTORETIC ) 20-25 MG tablet, Take 1 tablet by mouth daily., Disp: 90 tablet, Rfl: 3   OVER THE COUNTER MEDICATION, Probiotic with cranberry  1 sometimes, Disp: , Rfl:    vitamin C (ASCORBIC ACID) 500 MG tablet, Take 500 mg by mouth daily., Disp: , Rfl:  No Known Allergies   Review of Systems  Constitutional: Negative.   Eyes: Negative.  Negative for blurred vision.  Respiratory: Negative.  Negative for shortness of breath.   Cardiovascular: Negative.  Negative for chest pain and palpitations.  Gastrointestinal: Negative.   Musculoskeletal: Negative.   Skin: Negative.   Psychiatric/Behavioral: Negative.       Today's Vitals   12/31/23 1052  BP: 110/60  Pulse: (!) 59  Temp: 98.1 F (36.7 C)  TempSrc:  Oral  Weight: 164 lb (74.4 kg)  Height: 4' 11 (1.499 m)  PainSc: 0-No pain   Body mass index is 33.12 kg/m.  Wt Readings from Last 3 Encounters:  12/31/23 164 lb (74.4 kg)  12/13/23 163 lb (73.9 kg)  10/05/23 164 lb (74.4 kg)    The 10-year ASCVD risk score (Arnett DK, et al., 2019) is: 11.1%   Values used to calculate the score:     Age: 69 years     Clincally relevant sex: Female     Is Non-Hispanic African American: Yes     Diabetic: No     Tobacco smoker: No     Systolic Blood Pressure: 110 mmHg     Is BP treated: Yes     HDL Cholesterol: 59 mg/dL     Total Cholesterol: 200 mg/dL  Objective:  Physical Exam Vitals and nursing note reviewed.  Constitutional:      Appearance: Normal appearance. She is obese.  HENT:     Head: Normocephalic and atraumatic.     Nose:     Comments: Masked     Mouth/Throat:     Comments: Masked  Eyes:     Extraocular Movements: Extraocular movements intact.     Conjunctiva/sclera: Conjunctivae normal.     Pupils: Pupils are equal, round, and reactive to light.  Cardiovascular:     Rate and Rhythm: Normal rate and regular rhythm.     Heart sounds: Normal heart sounds.  Pulmonary:     Effort: Pulmonary effort is normal.     Breath sounds: Normal breath sounds.  Abdominal:     General: Abdomen is flat. Bowel sounds are normal.     Palpations: Abdomen is soft.  Genitourinary:    Comments: deferred Musculoskeletal:        General: Normal range of motion.     Cervical back: Normal range of motion and neck supple.  Skin:    General: Skin is warm and dry.  Neurological:     General: No focal deficit present.     Mental Status: She is alert and oriented to person, place, and time.  Psychiatric:        Mood and Affect: Mood normal.        Behavior: Behavior normal.      Assessment And Plan:   Assessment & Plan Hypertensive nephropathy Chronic, well controlled.  She will continue with amlodipine  10mg  daily and  lisinopril /hydrochlorothiazide  20/25mg  daily. Follow low sodium diet.  Chronic kidney disease, stage II (mild) Chronic, she is encouraged to keep BP well controlled and to stay well hydrated to decrease risk of CKD progression.   Pure hypercholesterolemia Chronic, she does not wish to start statin therapy. She is encouraged to follow heart healthy lifestyle. ASCVD risk is 11%.  Prediabetes Previous labs reviewed, her A1c has been elevated in the past. I will check an A1c today. Reminded to avoid refined sugars including sugary drinks/foods and processed meats including bacon, sausages and deli meats.   Palpitations Intermittent palpitations  with decreased frequency. No Zio monitor received. - Resent Zio monitor order. Class 1 obesity due to excess calories with serious comorbidity and body mass index (BMI) of 33.0 to 33.9 in adult She is encouraged to strive for BMI less than 30 to decrease cardiac risk. Advised to aim for at least 150 minutes of exercise per week.  Influenza vaccination declined  Herpes zoster vaccination declined   Orders Placed This Encounter  Procedures   Lipid panel   Liver Profile   Hemoglobin A1c   LONG TERM MONITOR XT (3-14 DAYS)    Return in about 4 months (around 04/29/2024) for BPC.  Patient was given opportunity to ask questions. Patient verbalized understanding of the plan and was able to repeat key elements of the plan. All questions were answered to their satisfaction.   I, Alyssa LOISE Slocumb, MD, have reviewed all documentation for this visit. The documentation on 12/31/23 for the exam, diagnosis, procedures, and orders are all accurate and complete.   IF YOU HAVE BEEN REFERRED TO A SPECIALIST, IT MAY TAKE 1-2 WEEKS TO SCHEDULE/PROCESS THE REFERRAL. IF YOU HAVE NOT HEARD FROM US /SPECIALIST IN TWO WEEKS, PLEASE GIVE US  A CALL AT [phone] X [phone].

## 2023-12-31 NOTE — Progress Notes (Unsigned)
 Enrolled for [employer] to mail a ZIO XT long term holter monitor to the patients address on file.   EP to read

## 2023-12-31 NOTE — Assessment & Plan Note (Signed)
 Previous labs reviewed, her A1c has been elevated in the past. I will check an A1c today. Reminded to avoid refined sugars including sugary drinks/foods and processed meats including bacon, sausages and deli meats.

## 2023-12-31 NOTE — Assessment & Plan Note (Signed)
 She is encouraged to strive for BMI less than 30 to decrease cardiac risk. Advised to aim for at least 150 minutes of exercise per week.

## 2023-12-31 NOTE — Patient Instructions (Signed)
 Hypertension, Adult Hypertension is another name for high blood pressure. High blood pressure forces your heart to work harder to pump blood. This can cause problems over time. There are two numbers in a blood pressure reading. There is a top number (systolic) over a bottom number (diastolic). It is best to have a blood pressure that is below 120/80. What are the causes? The cause of this condition is not known. Some other conditions can lead to high blood pressure. What increases the risk? Some lifestyle factors can make you more likely to develop high blood pressure: Smoking. Not getting enough exercise or physical activity. Being overweight. Having too much fat, sugar, calories, or salt (sodium) in your diet. Drinking too much alcohol. Other risk factors include: Having any of these conditions: Heart disease. Diabetes. High cholesterol. Kidney disease. Obstructive sleep apnea. Having a family history of high blood pressure and high cholesterol. Age. The risk increases with age. Stress. What are the signs or symptoms? High blood pressure may not cause symptoms. Very high blood pressure (hypertensive crisis) may cause: Headache. Fast or uneven heartbeats (palpitations). Shortness of breath. Nosebleed. Vomiting or feeling like you may vomit (nauseous). Changes in how you see. Very bad chest pain. Feeling dizzy. Seizures. How is this treated? This condition is treated by making healthy lifestyle changes, such as: Eating healthy foods. Exercising more. Drinking less alcohol. Your doctor may prescribe medicine if lifestyle changes do not help enough and if: Your top number is above 130. Your bottom number is above 80. Your personal target blood pressure may vary. Follow these instructions at home: Eating and drinking  If told, follow the DASH eating plan. To follow this plan: Fill one half of your plate at each meal with fruits and vegetables. Fill one fourth of your plate  at each meal with whole grains. Whole grains include whole-wheat pasta, brown rice, and whole-grain bread. Eat or drink low-fat dairy products, such as skim milk or low-fat yogurt. Fill one fourth of your plate at each meal with low-fat (lean) proteins. Low-fat proteins include fish, chicken without skin, eggs, beans, and tofu. Avoid fatty meat, cured and processed meat, or chicken with skin. Avoid pre-made or processed food. Limit the amount of salt in your diet to less than 1,500 mg each day. Do not drink alcohol if: Your doctor tells you not to drink. You are pregnant, may be pregnant, or are planning to become pregnant. If you drink alcohol: Limit how much you have to: 0-1 drink a day for women. 0-2 drinks a day for men. Know how much alcohol is in your drink. In the [place] one drink equals one 12 oz bottle of beer (355 mL), one 5 oz glass of wine (148 mL), or one 1 oz glass of hard liquor (44 mL). Lifestyle  Work with your doctor to stay at a healthy weight or to lose weight. Ask your doctor what the best weight is for you. Get at least 30 minutes of exercise that causes your heart to beat faster (aerobic exercise) most days of the week. This may include walking, swimming, or biking. Get at least 30 minutes of exercise that strengthens your muscles (resistance exercise) at least 3 days a week. This may include lifting weights or doing Pilates. Do not smoke or use any products that contain nicotine or tobacco. If you need help quitting, ask your doctor. Check your blood pressure at home as told by your doctor. Keep all follow-up visits. Medicines Take over-the-counter and prescription medicines  only as told by your doctor. Follow directions carefully. Do not skip doses of blood pressure medicine. The medicine does not work as well if you skip doses. Skipping doses also puts you at risk for problems. Ask your doctor about side effects or reactions to medicines that you should watch  for. Contact a doctor if: You think you are having a reaction to the medicine you are taking. You have headaches that keep coming back. You feel dizzy. You have swelling in your ankles. You have trouble with your vision. Get help right away if: You get a very bad headache. You start to feel mixed up (confused). You feel weak or numb. You feel faint. You have very bad pain in your: Chest. Belly (abdomen). You vomit more than once. You have trouble breathing. These symptoms may be an emergency. Get help right away. Call 911. Do not wait to see if the symptoms will go away. Do not drive yourself to the hospital. Summary Hypertension is another name for high blood pressure. High blood pressure forces your heart to work harder to pump blood. For most people, a normal blood pressure is less than 120/80. Making healthy choices can help lower blood pressure. If your blood pressure does not get lower with healthy choices, you may need to take medicine. This information is not intended to replace advice given to you by your health care provider. Make sure you discuss any questions you have with your health care provider. Document Revised: 11/18/2020 Document Reviewed: 11/18/2020 Elsevier Patient Education  2024 [employer].

## 2024-01-01 LAB — LIPID PANEL
Chol/HDL Ratio: 3.1 ratio (ref 0.0–4.4)
Cholesterol, Total: 176 mg/dL (ref 100–199)
HDL: 57 mg/dL (ref >39)
LDL Chol Calc (NIH): 108 mg/dL — ABNORMAL HIGH (ref 0–99)
Triglycerides: 54 mg/dL (ref 0–149)
VLDL Cholesterol Cal: 11 mg/dL (ref 5–40)

## 2024-01-01 LAB — HEPATIC FUNCTION PANEL
ALT: 13 IU/L (ref 0–32)
AST: 13 IU/L (ref 0–40)
Albumin: 4.3 g/dL (ref 3.8–4.8)
Alkaline Phosphatase: 55 IU/L (ref 49–135)
Bilirubin Total: 0.3 mg/dL (ref 0.0–1.2)
Bilirubin, Direct: 0.15 mg/dL (ref 0.00–0.40)
Total Protein: 7.7 g/dL (ref 6.0–8.5)

## 2024-01-01 LAB — HEMOGLOBIN A1C
Est. average glucose Bld gHb Est-mCnc: 131 mg/dL
Hgb A1c MFr Bld: 6.2 % — ABNORMAL HIGH (ref 4.8–5.6)

## 2024-01-03 ENCOUNTER — Ambulatory Visit: Payer: Self-pay | Admitting: Internal Medicine

## 2024-04-29 ENCOUNTER — Ambulatory Visit: Admitting: Internal Medicine

## 2024-06-25 ENCOUNTER — Ambulatory Visit: Payer: Self-pay

## 2024-06-25 ENCOUNTER — Encounter: Admitting: Internal Medicine

## 2024-07-16 ENCOUNTER — Encounter: Payer: Self-pay | Admitting: Internal Medicine
